# Patient Record
Sex: Female | Born: 1950 | Race: Black or African American | Hispanic: No | Marital: Married | Smoking: Never smoker
Health system: Southern US, Community
[De-identification: ages and names within clinical notes are randomized; demographics above are authoritative.]

## PROBLEM LIST (undated history)

## (undated) DIAGNOSIS — M8580 Other specified disorders of bone density and structure, unspecified site: Secondary | ICD-10-CM

## (undated) DIAGNOSIS — G629 Polyneuropathy, unspecified: Secondary | ICD-10-CM

## (undated) DIAGNOSIS — M109 Gout, unspecified: Secondary | ICD-10-CM

## (undated) DIAGNOSIS — R569 Unspecified convulsions: Secondary | ICD-10-CM

## (undated) DIAGNOSIS — I10 Essential (primary) hypertension: Secondary | ICD-10-CM

## (undated) HISTORY — DX: Other specified disorders of bone density and structure, unspecified site: M85.80

## (undated) HISTORY — DX: Gout, unspecified: M10.9

## (undated) HISTORY — DX: Unspecified convulsions: R56.9

## (undated) HISTORY — PX: MULTIPLE TOOTH EXTRACTIONS: SHX2053

## (undated) HISTORY — DX: Essential (primary) hypertension: I10

## (undated) HISTORY — DX: Polyneuropathy, unspecified: G62.9

---

## 1997-11-23 DIAGNOSIS — R569 Unspecified convulsions: Secondary | ICD-10-CM

## 1997-11-23 HISTORY — DX: Unspecified convulsions: R56.9

## 1997-12-09 ENCOUNTER — Emergency Department (HOSPITAL_COMMUNITY): Admission: EM | Admit: 1997-12-09 | Discharge: 1997-12-09 | Payer: Self-pay | Admitting: Emergency Medicine

## 1998-12-23 ENCOUNTER — Ambulatory Visit (HOSPITAL_COMMUNITY): Admission: RE | Admit: 1998-12-23 | Discharge: 1998-12-23 | Payer: Self-pay | Admitting: Obstetrics and Gynecology

## 1998-12-23 ENCOUNTER — Encounter: Payer: Self-pay | Admitting: Obstetrics and Gynecology

## 1999-08-29 ENCOUNTER — Encounter: Admission: RE | Admit: 1999-08-29 | Discharge: 1999-08-29 | Payer: Self-pay | Admitting: Family Medicine

## 1999-08-29 ENCOUNTER — Encounter: Payer: Self-pay | Admitting: Family Medicine

## 2000-10-15 ENCOUNTER — Encounter: Payer: Self-pay | Admitting: Family Medicine

## 2000-10-15 ENCOUNTER — Encounter: Admission: RE | Admit: 2000-10-15 | Discharge: 2000-10-15 | Payer: Self-pay | Admitting: Family Medicine

## 2002-01-26 ENCOUNTER — Encounter: Admission: RE | Admit: 2002-01-26 | Discharge: 2002-01-26 | Payer: Self-pay | Admitting: Family Medicine

## 2002-01-26 ENCOUNTER — Encounter: Payer: Self-pay | Admitting: Family Medicine

## 2003-03-23 ENCOUNTER — Other Ambulatory Visit: Admission: RE | Admit: 2003-03-23 | Discharge: 2003-03-23 | Payer: Self-pay | Admitting: Obstetrics and Gynecology

## 2006-02-01 ENCOUNTER — Encounter: Admission: RE | Admit: 2006-02-01 | Discharge: 2006-02-01 | Payer: Self-pay | Admitting: Pediatrics

## 2007-12-16 ENCOUNTER — Encounter (INDEPENDENT_AMBULATORY_CARE_PROVIDER_SITE_OTHER): Payer: Self-pay | Admitting: Obstetrics and Gynecology

## 2007-12-16 ENCOUNTER — Ambulatory Visit (HOSPITAL_COMMUNITY): Admission: RE | Admit: 2007-12-16 | Discharge: 2007-12-16 | Payer: Self-pay | Admitting: Obstetrics and Gynecology

## 2010-01-28 ENCOUNTER — Encounter: Admission: RE | Admit: 2010-01-28 | Discharge: 2010-01-28 | Payer: Self-pay | Admitting: Internal Medicine

## 2010-07-08 NOTE — H&P (Signed)
Whitney Hines, Whitney Hines NO.:  [id number]   MEDICAL RECORD NO.:  [id number]          PATIENT TYPE:  AMB   LOCATION:  SDC                           FACILITY:  [facility]   PHYSICIAN:  Juluis Mire, M.D.   DATE OF BIRTH:  1950/10/21   DATE OF ADMISSION:  DATE OF DISCHARGE:                              HISTORY & PHYSICAL   The patient is a 60 year old gravida 2, para 2 female who presents for  LEEP procedure in the cervix along with hysteroscopy and D and C.   In relation to present admission, first of all the patient's last Pap  feel atypical squamous cells of undetermined significance did test  positive for high-risk HPV.  Underwent colposcopy with biopsy with  finding of low-grade dysplasia.  We initially thought about freezing the  cervix in the office, but in view of proceeding with hysteroscopy, we  were going to do LEEP of the cervix at the same time.  Nature of LEEP  has been discussed.   It is also known her Pap smear that she had benign appearing endometrial  cells and blood.  Her previous FSH was consistent with menopause.  We  did a saline infusion ultrasound that revealed intracavitary mass  consistent with polyp.  She now proceeds for hysteroscopic resection.   In terms of allergies, the patient has no known drug allergies.   MEDICATION:  Toprol-XL 25 mg and Ortho-Novum 7/7/7.   PAST MEDICAL HISTORY:  She does have a history of heart irregularity  that is why she is on Toprol followed by cardiologist, otherwise, usual  childhood diseases.  No previous surgical history.   OBSTETRICAL HISTORY:  Two vaginal deliveries.   FAMILY HISTORY:  There is a history of hypertension, diabetes,  osteoporosis as well as heart disease.   SOCIAL HISTORY:  Reveals no tobacco or alcohol use.   REVIEW OF SYSTEMS:  Noncontributory.   PHYSICAL EXAMINATION:  VITAL SIGNS:  The patient is afebrile, stable  vital signs.  HEENT:  The patient is normocephalic.  Pupils equal,  round, and reactive  to light and accommodation.  Extraocular movements were intact.  Sclerae  and conjunctiva are clear.  Oropharynx clear.  NECK:  Without thyromegaly.  BREASTS:  No discrete masses.  LUNGS:  Clear.  CARDIOVASCULAR:  Regular rate. No murmurs or gallop.  ABDOMEN:  Benign.  No mass, organomegaly, or tenderness.  PELVIC:  Normal external genitalia.  Vaginal mucosa is clear.  Cervix  unremarkable.  Uterus normal size, shape, and contour.  Adnexa free of  masses or tenderness.   IMPRESSION:  1. Cervical dysplasia.  2. Endometrial polyp.   PLAN:  The patient will undergo LEEP of the cervix along with  hysteroscopy D and C.  The risks have been discussed including the risk  of infection.  The risk of vascular injury that could lead to  hemorrhage, requiring transfusion with the risk of AIDS or hepatitis.  Excessive bleeding could require hysterectomy.  There is a risk of  perforation lead to injury to adjacent organs such as bowel require  exploratory  surgery.  The risk of deep venous thrombosis and pulmonary  embolus.  The patient appears to understand indications and risks.      Juluis Mire, M.D.  Electronically Signed     JSM/MEDQ  D:  12/16/2007  T:  12/16/2007  Job:  [id number]

## 2010-07-08 NOTE — Op Note (Signed)
Whitney Hines, Whitney Hines                  ACCOUNT NO.:  [id number]   MEDICAL RECORD NO.:  [id number]          PATIENT TYPE:  AMB   LOCATION:  SDC                           [facility]:  [facility]   PHYSICIAN:  Dois Davenport A. Rivard, M.D. DATE OF BIRTH:  November 25, 1950   DATE OF PROCEDURE:  12/16/2007  DATE OF DISCHARGE:                               OPERATIVE REPORT   PREOPERATIVE DIAGNOSIS:  Endometrial polyp.   POSTOPERATIVE DIAGNOSIS:  Endometrial atrophy.   ANESTHESIA:  IV sedation with Dr. Jean Rosenthal.   PROCEDURES:  Hysteroscopy, dilation and curettage.   SURGEON:  Crist Fat. Rivard, MD   ASSISTANT:  None.   ESTIMATED BLOOD LOSS:  Minimal.   PROCEDURE:  After being informed of the planned procedure with possible  complications including bleeding, infection and injury to uterus,  informed consent was obtained.  The patient was taken to OR #7 given IV  sedation with laryngeal mask and placed in the lithotomy position.  She  was prepped and draped in a sterile fashion and her bladder was emptied  with an in-and-out Foley catheter.  Pelvic exam was unremarkable with a  small uterus and 2 normal adnexa.  A weighted speculum was inserted.  Anterior lip of the cervix was grasped with a tenaculum forceps and we  proceeded with a paracervical block using Novocaine 1% 20 mL in the  usual fashion.  Uterus was sounded at 6 cm and the cervix was easily  dilated using Hegar dilator until #33, which allowed easy entry of an  operative hysteroscope with a single loop attached.  With a perfusion of  sorbitol at a maximum pressure of 80 mmHg, we were able to visualize the  entire uterine cavity with both tubal ostia and there was no sign of any  polyp.  On the left side of the uterine wall, there was a slight  muscular ridge, which could have accounted for the polyp identified on  sonohysterogram.  Hysteroscope was removed.  A sharp curette was used to  remove the superficial endometrium for analysis and instruments  were  removed.  A bleeding site on the cervix was controlled with a figure-of-  eight stitch of 2-0 Vicryl.  Instrument and sponge count was complete  x2.  Estimated blood loss was minimal.  Water deficit was 30 mL and the  procedure was very well tolerated by the patient who was taken to  recovery room in a well and stable condition and discharged home today.   SPECIMEN:  Endometrial curettings sent to pathology.      Crist Fat Rivard, M.D.  Electronically Signed    SAR/MEDQ  D:  12/16/2007  T:  12/17/2007  Job:  [id number]

## 2010-11-25 LAB — CBC
Hemoglobin: 13.4
Platelets: 254
RDW: 13.1

## 2010-11-25 LAB — COMPREHENSIVE METABOLIC PANEL
ALT: 24
AST: 31
Albumin: 3.7
Alkaline Phosphatase: 123 — ABNORMAL HIGH
Calcium: 8.9
GFR calc Af Amer: >60
Potassium: 3.6
Sodium: 136
Total Protein: 8.5 — ABNORMAL HIGH

## 2011-04-20 ENCOUNTER — Other Ambulatory Visit: Payer: Self-pay | Admitting: Internal Medicine

## 2011-04-20 DIAGNOSIS — Z1231 Encounter for screening mammogram for malignant neoplasm of breast: Secondary | ICD-10-CM

## 2011-05-04 ENCOUNTER — Ambulatory Visit
Admission: RE | Admit: 2011-05-04 | Discharge: 2011-05-04 | Disposition: A | Discharge status: Home / Self Care | Payer: BC Managed Care – PPO | Source: Ambulatory Visit | Attending: Internal Medicine | Admitting: Internal Medicine

## 2011-05-04 DIAGNOSIS — Z1231 Encounter for screening mammogram for malignant neoplasm of breast: Secondary | ICD-10-CM

## 2012-06-16 ENCOUNTER — Other Ambulatory Visit: Payer: Self-pay | Admitting: Neurology

## 2012-12-17 ENCOUNTER — Other Ambulatory Visit: Payer: Self-pay | Admitting: Neurology

## 2012-12-20 ENCOUNTER — Other Ambulatory Visit: Payer: Self-pay

## 2012-12-20 MED ORDER — PHENOBARBITAL 100 MG PO TABS: 100.0000 mg | ORAL_TABLET | Freq: Every day | ORAL | Status: DC

## 2013-02-09 ENCOUNTER — Ambulatory Visit: Payer: Self-pay | Admitting: Nurse Practitioner

## 2013-05-04 ENCOUNTER — Encounter: Payer: Self-pay | Admitting: Nurse Practitioner

## 2013-05-15 ENCOUNTER — Encounter: Payer: Self-pay | Admitting: Nurse Practitioner

## 2013-05-15 ENCOUNTER — Encounter (INDEPENDENT_AMBULATORY_CARE_PROVIDER_SITE_OTHER): Payer: Self-pay

## 2013-05-15 ENCOUNTER — Ambulatory Visit (INDEPENDENT_AMBULATORY_CARE_PROVIDER_SITE_OTHER): Payer: BC Managed Care – PPO | Admitting: Nurse Practitioner

## 2013-05-15 VITALS — BP 121/73 | HR 88 | Ht 61.5 in | Wt 179.0 lb

## 2013-05-15 DIAGNOSIS — Z5181 Encounter for therapeutic drug level monitoring: Secondary | ICD-10-CM

## 2013-05-15 DIAGNOSIS — G40309 Generalized idiopathic epilepsy and epileptic syndromes, not intractable, without status epilepticus: Secondary | ICD-10-CM | POA: Insufficient documentation

## 2013-05-15 MED ORDER — PHENOBARBITAL 100 MG PO TABS: 100.0000 mg | ORAL_TABLET | Freq: Every day | ORAL | Status: DC

## 2013-05-15 NOTE — Progress Notes (Signed)
I have read the note, and I agree with the clinical assessment and plan.  Mahum Betten KEITH   

## 2013-05-15 NOTE — Progress Notes (Signed)
[facility]  PATIENT: Whitney Hines DOB: May 30, 1950   REASON FOR VISIT: follow up for seizure disorder    HISTORY OF PRESENT ILLNESS:Whitney Hines , 63 year old female returns for followup. She has a history of seizure disorder with last seizure occurring in 1999. She denies any difficulty tolerating her phenobarbital, she has not had any balance issues, no falls no sleep disturbance. She continues to work full time and she is very active. She returns for reevaluation   HISTORY:of seizure disorder and has been well controlled on phenobarbital. Last seizure was in 1999. She denies any difficulty tolerating her medication, no confusion,staring spells, no balance issues, sleep disturbance ,no falls. She continues to work full-time,she is very active and has continued her exercise program.   REVIEW OF SYSTEMS: Full 14 system review of systems performed and notable only for those listed, all others are neg:  Constitutional: N/A  Cardiovascular: N/A  Ear/Nose/Throat: N/A  Skin: N/A  Eyes: N/A  Respiratory: N/A  Gastroitestinal: N/A  Hematology/Lymphatic: N/A  Endocrine: N/A Musculoskeletal:N/A  Allergy/Immunology: N/A  Neurological: N/A Psychiatric: N/A   ALLERGIES: Allergies  Allergen Reactions  . Dilantin [Phenytoin]     HOME MEDICATIONS: Outpatient Prescriptions Prior to Visit  Medication Sig Dispense Refill  . lisinopril-hydrochlorothiazide (PRINZIDE,ZESTORETIC) 20-12.5 MG per tablet Take 1 tablet by mouth daily.      Marland Kitchen PHENObarbital (LUMINAL) 100 MG tablet Take 1 tablet (100 mg total) by mouth at bedtime.  90 tablet  1   No facility-administered medications prior to visit.    PAST MEDICAL HISTORY: Past Medical History  Diagnosis Date  . High blood pressure   . Seizure     PAST SURGICAL HISTORY: Past Surgical History  Procedure Laterality Date  . None      FAMILY HISTORY: History reviewed. No pertinent family history.  SOCIAL  HISTORY: History   Social History  . Marital Status: Married    Spouse Name: N/A    Number of Children: 0  . 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 Smoking status: Never Smoker   . Smokeless tobacco: Never Used  . Alcohol Use: No  . Drug Use: No  . Sexual Activity: Not on file   Other Topics Concern  . 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    Patient has BS degree.    Patient does not have any children.    Patient drinks 8 ounces of caffeine daily.    Patient is left handed.      PHYSICAL EXAM  Filed Vitals:   05/15/13 1512  BP: 121/73  Pulse: 88  Height: 5' 1.5" (1.562 m)  Weight: 179 lb (81.194 kg)   Body mass index is 33.28 kg/(m^2).  Generalized: Well developed, in no acute distress  Neurological examination   Mentation: Alert oriented to time, place, history taking. Follows all commands speech and language fluent  Cranial nerve II-XII: Pupils were equal round reactive to light extraocular movements were full, visual field were full on confrontational test. Facial sensation and strength were normal. hearing was intact to finger rubbing bilaterally. Uvula tongue midline. head turning and shoulder shrug were normal and symmetric.Tongue protrusion into cheek strength was normal. Motor: normal bulk and tone, full strength in the BUE, BLE, fine  finger movements normal, no pronator drift. No focal weakness Coordination: finger-nose-finger, heel-to-shin bilaterally, no dysmetria Reflexes: Brachioradialis 2/2, biceps 2/2, triceps 2/2, patellar 2/2, Achilles 2/2, plantar responses were flexor bilaterally. Gait and Station: Rising up from seated position without assistance, normal stance,  moderate stride, good arm swing, smooth turning, able to perform tiptoe, and heel walking  without difficulty. Tandem gait is steady  DIAGNOSTIC DATA (LABS, IMAGING, TESTING) - ASSESSMENT AND PLAN  63 y.o. year old female  has a past medical history of High blood pressure and Seizure. here to followup for seizure disorder. Last seizure occurred in 1999.  Pt to continue Phenobarb at current dose.  Will refill Will get labs from Dr. Arbutus Ped F/U in 1 year. [name], [facility], [facility], APRN  [facility] [address], [place], [place] [address] [phone]

## 2013-05-15 NOTE — Patient Instructions (Signed)
Pt to continue Phenobarb at current dose.  Will refill Will get labs from Dr. Arbutus Ped F/U in 1 year.

## 2013-06-23 ENCOUNTER — Other Ambulatory Visit: Payer: Self-pay | Admitting: Neurology

## 2013-08-23 ENCOUNTER — Ambulatory Visit (HOSPITAL_BASED_OUTPATIENT_CLINIC_OR_DEPARTMENT_OTHER)
Admission: RE | Admit: 2013-08-23 | Discharge: 2013-08-23 | Disposition: A | Discharge status: Home / Self Care | Payer: Worker's Compensation | Source: Ambulatory Visit | Attending: Nurse Practitioner | Admitting: Nurse Practitioner

## 2013-08-23 ENCOUNTER — Emergency Department (HOSPITAL_BASED_OUTPATIENT_CLINIC_OR_DEPARTMENT_OTHER)
Admission: EM | Admit: 2013-08-23 | Discharge: 2013-08-23 | Disposition: A | Discharge status: Home / Self Care | Payer: BC Managed Care – PPO

## 2013-08-23 DIAGNOSIS — W19XXXA Unspecified fall, initial encounter: Secondary | ICD-10-CM | POA: Insufficient documentation

## 2013-08-23 DIAGNOSIS — S0990XA Unspecified injury of head, initial encounter: Secondary | ICD-10-CM | POA: Diagnosis not present

## 2013-08-31 ENCOUNTER — Telehealth: Payer: Self-pay | Admitting: Nurse Practitioner

## 2013-08-31 NOTE — Telephone Encounter (Signed)
Patient requesting appointment with Hoyle Sauer due to fall at work and hit back of head.  She's had skull xray and Ct scan and would like to see CM, which was encouraged by her employer.  Please call and advise.

## 2013-09-01 NOTE — Telephone Encounter (Signed)
Patient is scheduled for 09-05-13

## 2013-09-01 NOTE — Telephone Encounter (Signed)
Please schedule appt

## 2013-09-05 ENCOUNTER — Encounter: Payer: Self-pay | Admitting: Nurse Practitioner

## 2013-09-05 ENCOUNTER — Ambulatory Visit (INDEPENDENT_AMBULATORY_CARE_PROVIDER_SITE_OTHER): Payer: BC Managed Care – PPO | Admitting: Nurse Practitioner

## 2013-09-05 ENCOUNTER — Encounter (INDEPENDENT_AMBULATORY_CARE_PROVIDER_SITE_OTHER): Payer: Self-pay

## 2013-09-05 VITALS — BP 128/78 | HR 75 | Ht 61.5 in | Wt 179.8 lb

## 2013-09-05 DIAGNOSIS — S060X0A Concussion without loss of consciousness, initial encounter: Secondary | ICD-10-CM

## 2013-09-05 DIAGNOSIS — G40309 Generalized idiopathic epilepsy and epileptic syndromes, not intractable, without status epilepticus: Secondary | ICD-10-CM

## 2013-09-05 NOTE — Patient Instructions (Signed)
Continue phenobarbital at current dose Given information on concussion Continue gabapentin at current dose Followup in 6 months

## 2013-09-05 NOTE — Progress Notes (Signed)
I have read the note, and I agree with the clinical assessment and plan.  Whitney Hines,Whitney Hines   

## 2013-09-05 NOTE — Progress Notes (Signed)
[facility]  PATIENT: Whitney Hines DOB: November 22, 1950   REASON FOR VISIT: Followup for seizure disorder, concussion   HISTORY OF PRESENT ILLNESS:[name] , 63 year old female returns for followup. She has a history of seizure disorder with last seizure occurring in 1999. She denies any difficulty tolerating her phenobarbital. She fell at work and hit the back of her head on 08/23/2013. She works for [employer]. She was taken to urgent care and into the emergency room at that time. CT of the head was negative for anything acute. She was given gabapentin to take. She is having an occasional headache along with ringing in the ears. She continues to work full time but is working a different shift at present . She returns for reevaluation  HISTORY:of seizure disorder and has been well controlled on phenobarbital. Last seizure was in 1999. She denies any difficulty tolerating her medication, no confusion,staring spells, no balance issues, sleep disturbance ,no falls. She continues to work full-time,she is very active and has continued her exercise program.     REVIEW OF SYSTEMS: Full 14 system review of systems performed and notable only for those listed, all others are neg:  Constitutional: N/A  Cardiovascular: N/A  Ear/Nose/Throat: Ringing in the ears Skin: N/A  Eyes: N/A  Respiratory: N/A  Gastroitestinal: N/A  Hematology/Lymphatic: N/A  Endocrine: N/A Musculoskeletal:N/A  Allergy/Immunology: N/A  Neurological: Headache  Psychiatric: N/A Sleep : NA   ALLERGIES: Allergies  Allergen Reactions  . Dilantin [Phenytoin]     HOME MEDICATIONS: Outpatient Prescriptions Prior to Visit  Medication Sig Dispense Refill  . lisinopril-hydrochlorothiazide (PRINZIDE,ZESTORETIC) 20-12.5 MG per tablet Take 1 tablet by mouth daily.      Marland Kitchen PHENObarbital (LUMINAL) 100 MG tablet Take 1 tablet (100 mg total) by mouth at bedtime.  90 tablet  1   No facility-administered  medications prior to visit.    PAST MEDICAL HISTORY: Past Medical History  Diagnosis Date  . High blood pressure   . Seizure     PAST SURGICAL HISTORY: Past Surgical History  Procedure Laterality Date  . None      FAMILY HISTORY: History reviewed. No pertinent family history.  SOCIAL HISTORY: History   Social History  . Marital Status: Married    Spouse Name: N/A    Number of Children: 0  . 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 Smoking status: Never Smoker   . Smokeless tobacco: Never Used  . Alcohol Use: No  . Drug Use: No  . Sexual Activity: Not on file   Other Topics Concern  . 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    Patient has BS degree.    Patient does not have any children.    Patient drinks 8 ounces of caffeine daily.    Patient is left handed.      PHYSICAL EXAM  Filed Vitals:   09/05/13 1514  BP: 128/78  Pulse: 75  Height: 5' 1.5" (1.562 m)  Weight: 179 lb 12.8 oz (81.557 kg)   Body mass index is 33.43 kg/(m^2). Generalized: Well developed, in no acute distress , bruising noted under right eye  Neurological examination  Mentation: Alert oriented to time, place, history taking. Follows all commands speech and language fluent  Cranial  nerve II-XII: Pupils were equal round reactive to light extraocular movements were full, visual field were full on confrontational test. Facial sensation and strength were normal. hearing was intact to finger rubbing bilaterally. Uvula tongue midline. head turning and shoulder shrug were normal and symmetric.Tongue protrusion into cheek strength was normal.  Motor: normal bulk and tone, full strength in the BUE, BLE, fine finger movements normal, no pronator drift. No focal weakness  Coordination: finger-nose-finger,  heel-to-shin bilaterally, no dysmetria  Sensory: Intact to touch, pinprick and vibratory Reflexes: Brachioradialis 2/2, biceps 2/2, triceps 2/2, patellar 2/2, Achilles 2/2, plantar responses were flexor bilaterally.  Gait and Station: Rising up from seated position without assistance, normal stance, moderate stride, good arm swing, smooth turning, able to perform tiptoe, and heel walking without difficulty. Tandem gait is steady  DIAGNOSTIC DATA (LABS, IMAGING, TESTING)  ASSESSMENT AND PLAN  63 y.o. year old female  has a past medical history of High blood pressure and Seizure. and recent fall with concussion.  Continue phenobarbital at current dose Given information on concussion Call for any worsening of headache or seizure activity Continue gabapentin at current dose Followup in 6 months [name], [facility], [facility], [facility] [address] [place], [place] [address] [phone]

## 2013-12-26 ENCOUNTER — Other Ambulatory Visit: Payer: Self-pay

## 2013-12-26 MED ORDER — PHENOBARBITAL 100 MG PO TABS: 100.0000 mg | ORAL_TABLET | Freq: Every day | ORAL | Status: DC

## 2013-12-26 NOTE — Telephone Encounter (Signed)
RX signed and faxed.

## 2014-01-10 ENCOUNTER — Encounter: Payer: Self-pay | Admitting: Neurology

## 2014-01-16 ENCOUNTER — Encounter: Payer: Self-pay | Admitting: Neurology

## 2014-02-07 ENCOUNTER — Telehealth: Payer: Self-pay | Admitting: Nurse Practitioner

## 2014-02-07 NOTE — Telephone Encounter (Signed)
Patient is calling because she needs to discuss her father that coded on 01/30/14. Patient also has questions about FMLA paperwork that she will fax to our office. Please call.

## 2014-02-07 NOTE — Telephone Encounter (Signed)
I called the patient and found out that her Dad has been in the hospital and that the patient wanted to get FMLA paperwork completed because of her being out of work to spend time caring for her Dad.  I explained to her that she would need to go through her PCP or sometimes the physician of the patient will complete FMLA paperwork for the family members who are caring for their patient.  Pt voiced understanding.

## 2014-02-23 HISTORY — PX: COLONOSCOPY: SHX174

## 2014-03-08 ENCOUNTER — Encounter: Payer: Self-pay | Admitting: Nurse Practitioner

## 2014-03-08 ENCOUNTER — Ambulatory Visit (INDEPENDENT_AMBULATORY_CARE_PROVIDER_SITE_OTHER): Payer: BLUE CROSS/BLUE SHIELD | Admitting: Nurse Practitioner

## 2014-03-08 VITALS — BP 129/80 | HR 99 | Ht 62.0 in | Wt 174.0 lb

## 2014-03-08 DIAGNOSIS — S060X0D Concussion without loss of consciousness, subsequent encounter: Secondary | ICD-10-CM

## 2014-03-08 DIAGNOSIS — G40309 Generalized idiopathic epilepsy and epileptic syndromes, not intractable, without status epilepticus: Secondary | ICD-10-CM

## 2014-03-08 DIAGNOSIS — Z5181 Encounter for therapeutic drug level monitoring: Secondary | ICD-10-CM

## 2014-03-08 MED ORDER — PHENOBARBITAL 100 MG PO TABS: 100.0000 mg | ORAL_TABLET | Freq: Every day | ORAL | Status: DC

## 2014-03-08 NOTE — Patient Instructions (Signed)
Continue Phenobarb at current dose Call for seizure disorder Will get labs today F/U 6 months

## 2014-03-08 NOTE — Progress Notes (Signed)
I have read the note, and I agree with the clinical assessment and plan.  WILLIS,CHARLES KEITH   

## 2014-03-08 NOTE — Progress Notes (Signed)
[facility]  PATIENT: Whitney Hines DOB: 07-Nov-1950   REASON FOR VISIT: Follow-up for seizure disorder  HISTORY FROM: Patient    HISTORY OF PRESENT ILLNESS:Whitney Hines , 64 year old female returns for followup. She has a history of seizure disorder with last seizure occurring in 1999. She denies any difficulty tolerating her phenobarbital. She fell at work and hit the back of her head on 08/23/2013. She works for [employer]. She was taken to urgent care and into the emergency room at that time. CT of the head was negative for anything acute. She was given gabapentin to take. She is having an occasional headache along with ringing in the ears. She continues to work full time but is working a different shift at present . She is under a lot of stress with her father being hospitalized in [place] and  her husband currently sick here.  She returns for reevaluation. She needs lab work and refills  HISTORY:of seizure disorder and has been well controlled on phenobarbital. Last seizure was in 1999. She denies any difficulty tolerating her medication, no confusion,staring spells, no balance issues, sleep disturbance ,no falls. She continues to work full-time,she is very active and has continued her exercise program.     REVIEW OF SYSTEMS: Full 14 system review of systems performed and notable only for those listed, all others are neg:  Constitutional: Fatigue Cardiovascular: neg Ear/Nose/Throat: neg  Skin: neg Eyes: neg Respiratory: neg Gastroitestinal: neg  Hematology/Lymphatic: neg  Endocrine: neg Musculoskeletal:neg Allergy/Immunology: neg Neurological: Occasional headache, history of seizure disorder Psychiatric: neg Sleep : neg   ALLERGIES: Allergies  Allergen Reactions  . Dilantin [Phenytoin]     HOME MEDICATIONS: Outpatient Prescriptions Prior to Visit  Medication Sig Dispense Refill  . gabapentin (NEURONTIN) 100 MG capsule Take 100 mg by mouth  daily.     Marland Kitchen lisinopril-hydrochlorothiazide (PRINZIDE,ZESTORETIC) 20-12.5 MG per tablet Take 1 tablet by mouth daily.    Marland Kitchen PHENObarbital (LUMINAL) 100 MG tablet Take 1 tablet (100 mg total) by mouth at bedtime. 90 tablet 1   No facility-administered medications prior to visit.    PAST MEDICAL HISTORY: Past Medical History  Diagnosis Date  . High blood pressure   . Seizure     PAST SURGICAL HISTORY: Past Surgical History  Procedure Laterality Date  . None      FAMILY HISTORY: History reviewed. No pertinent family history.  SOCIAL HISTORY: History   Social History  . Marital Status: Married    Spouse Name: N/A    Number of Children: 0  . 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 Smoking status: Never Smoker   . Smokeless tobacco: Never Used  . Alcohol Use: No  . Drug Use: No  . Sexual Activity: Not on file   Other Topics Concern  . 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    Patient has BS degree.    Patient does not have any children.    Patient drinks 8 ounces of caffeine daily.    Patient is left handed.      PHYSICAL EXAM  Filed Vitals:   03/08/14 1515  BP: 129/80  Pulse: 99  Height: 5\' 2"  (1.575 m)  Weight: 174 lb (78.926 kg)   Body mass index  is 31.82 kg/(m^2). Generalized: Well developed, in no acute distress   Neurological examination  Mentation: Alert oriented to time, place, history taking. Follows all commands speech and language fluent  Cranial nerve II-XII: Pupils were equal round reactive to light extraocular movements were full, visual field were full on confrontational test. Facial sensation and strength were normal. hearing was intact to finger rubbing bilaterally. Uvula tongue midline. head turning and shoulder shrug were normal and  symmetric.Tongue protrusion into cheek strength was normal.  Motor: normal bulk and tone, full strength in the BUE, BLE, fine finger movements normal, no pronator drift. No focal weakness  Coordination: finger-nose-finger, heel-to-shin bilaterally, no dysmetria  Sensory: Intact to touch, pinprick and vibratory Reflexes: Brachioradialis 2/2, biceps 2/2, triceps 2/2, patellar 2/2, Achilles 2/2, plantar responses were flexor bilaterally.  Gait and Station: Rising up from seated position without assistance, normal stance, moderate stride, good arm swing, smooth turning, able to perform tiptoe, and heel walking without difficulty. Tandem gait is steady  DIAGNOSTIC DATA (LABS, IMAGING, TESTING) - ASSESSMENT AND PLAN  64 y.o. year old female  has a past medical history of High blood pressure and Seizure. here to follow-up. Seizure disorder stable  Continue Phenobarb at current dose, RX to patient Call for seizure activity Will get labs today, CBC, CMP and PB level F/U 6 months Whitney Bible, Whitney Hines, Whitney Hines, Whitney Hines [address], [place], [place] [address] [phone]

## 2014-03-09 LAB — CBC WITH DIFFERENTIAL/PLATELET
Basophils Absolute: 0 10*3/uL (ref 0.0–0.2)
Basos: 0 %
Eos: 0 %
Eosinophils Absolute: 0 10*3/uL (ref 0.0–0.4)
HCT: 35.8 % (ref 34.0–46.6)
HEMOGLOBIN: 12 g/dL (ref 11.1–15.9)
Immature Grans (Abs): 0 10*3/uL (ref 0.0–0.1)
Immature Granulocytes: 0 %
Lymphocytes Absolute: 2.8 10*3/uL (ref 0.7–3.1)
Lymphs: 38 %
MCH: 30.1 pg (ref 26.6–33.0)
MCHC: 33.5 g/dL (ref 31.5–35.7)
MCV: 90 fL (ref 79–97)
MONOS ABS: 0.3 10*3/uL (ref 0.1–0.9)
Monocytes: 4 %
NEUTROS PCT: 58 %
Neutrophils Absolute: 4.2 10*3/uL (ref 1.4–7.0)
RBC: 3.99 x10E6/uL (ref 3.77–5.28)
RDW: 13.6 % (ref 12.3–15.4)
WBC: 7.4 10*3/uL (ref 3.4–10.8)

## 2014-03-09 LAB — COMPREHENSIVE METABOLIC PANEL
ALBUMIN: 4.2 g/dL (ref 3.6–4.8)
ALK PHOS: 161 IU/L — AB (ref 39–117)
ALT: 15 IU/L (ref 0–32)
AST: 19 IU/L (ref 0–40)
Albumin/Globulin Ratio: 1.1 (ref 1.1–2.5)
BUN/Creatinine Ratio: 12 (ref 11–26)
BUN: 13 mg/dL (ref 8–27)
CALCIUM: 9 mg/dL (ref 8.7–10.3)
CO2: 23 mmol/L (ref 18–29)
Chloride: 101 mmol/L (ref 97–108)
Creatinine, Ser: 1.09 mg/dL — ABNORMAL HIGH (ref 0.57–1.00)
GFR calc non Af Amer: 55 mL/min/{1.73_m2} — ABNORMAL LOW (ref >59)
GFR, EST AFRICAN AMERICAN: 63 mL/min/{1.73_m2} (ref >59)
GLOBULIN, TOTAL: 3.9 g/dL (ref 1.5–4.5)
Glucose: 78 mg/dL (ref 65–99)
Potassium: 4.2 mmol/L (ref 3.5–5.2)
SODIUM: 140 mmol/L (ref 134–144)
Total Bilirubin: <0.2 mg/dL (ref 0.0–1.2)
Total Protein: 8.1 g/dL (ref 6.0–8.5)

## 2014-03-09 LAB — PHENOBARBITAL LEVEL: PHENOBARBITAL LVL: 34 ug/mL (ref 15–40)

## 2014-05-16 ENCOUNTER — Ambulatory Visit: Payer: BC Managed Care – PPO | Admitting: Nurse Practitioner

## 2014-06-07 ENCOUNTER — Other Ambulatory Visit: Payer: Self-pay | Admitting: Gastroenterology

## 2014-07-02 ENCOUNTER — Other Ambulatory Visit: Payer: Self-pay | Admitting: Neurology

## 2014-07-02 MED ORDER — PHENOBARBITAL 100 MG PO TABS: 100.0000 mg | ORAL_TABLET | Freq: Every day | ORAL | Status: DC

## 2014-07-02 NOTE — Telephone Encounter (Signed)
Patient called and stated that she needed a refill on her Rx. PHENObarbital (LUMINAL) 100 MG tablet.She said that her pharmacy told her they were waiting on [place] in order to fill it. Please call and advise.

## 2014-07-02 NOTE — Telephone Encounter (Signed)
Request entered, forwarded to provider for approval.  

## 2014-07-03 NOTE — Telephone Encounter (Signed)
Rx has been faxed.

## 2014-09-11 ENCOUNTER — Encounter (HOSPITAL_COMMUNITY): Payer: Self-pay | Admitting: *Deleted

## 2014-09-13 ENCOUNTER — Ambulatory Visit (INDEPENDENT_AMBULATORY_CARE_PROVIDER_SITE_OTHER): Payer: BLUE CROSS/BLUE SHIELD | Admitting: Nurse Practitioner

## 2014-09-13 ENCOUNTER — Encounter: Payer: Self-pay | Admitting: Nurse Practitioner

## 2014-09-13 VITALS — BP 124/74 | HR 90 | Ht 62.0 in | Wt 184.0 lb

## 2014-09-13 DIAGNOSIS — Z5181 Encounter for therapeutic drug level monitoring: Secondary | ICD-10-CM

## 2014-09-13 DIAGNOSIS — G40309 Generalized idiopathic epilepsy and epileptic syndromes, not intractable, without status epilepticus: Secondary | ICD-10-CM

## 2014-09-13 NOTE — Patient Instructions (Signed)
Continue phenobarbital at current dose Labs reviewed from 6 months ago Follow-up in 6 months Call for any seizure activity

## 2014-09-13 NOTE — Progress Notes (Signed)
[facility]  PATIENT: Whitney Hines DOB: 11/09/1950   REASON FOR VISIT: Follow-up for seizure disorder, history of concussion HISTORY FROM: Patient    HISTORY OF PRESENT ILLNESS:Whitney Hines , 64 year old female returns for followup. She has a history of seizure disorder with last seizure occurring in 1999. She denies any difficulty tolerating her phenobarbital. She fell at work and hit the back of her head on 08/23/2013. She works for [employer]. She was taken to urgent care and into the emergency room at that time. CT of the head was negative for anything acute. She was given gabapentin to take. She is having an occasional headache along with ringing in the ears. She denies any problems with her memory She continues to work full time but is working a different shift at present . She is under a lot of stress father recently died  and her husband currently sick here. She returns for reevaluation. Reviewed labs from 6 months ago  HISTORY:of seizure disorder and has been well controlled on phenobarbital. Last seizure was in 1999. She denies any difficulty tolerating her medication, no confusion,staring spells, no balance issues, sleep disturbance ,no falls. She continues to work full-time,she is very active and has continued her exercise program.     REVIEW OF SYSTEMS: Full 14 system review of systems performed and notable only for those listed, all others are neg:  Constitutional: neg  Cardiovascular: neg Ear/Nose/Throat: neg  Skin: neg Eyes: neg Respiratory: neg Gastroitestinal: neg  Hematology/Lymphatic: neg  Endocrine: neg Musculoskeletal:neg Allergy/Immunology: neg Neurological: neg Psychiatric: neg Sleep : neg   ALLERGIES: Allergies  Allergen Reactions  . Dilantin [Phenytoin]     HOME MEDICATIONS: Outpatient Prescriptions Prior to Visit  Medication Sig Dispense Refill  . lisinopril-hydrochlorothiazide (PRINZIDE,ZESTORETIC) 20-12.5 MG per tablet  Take 1 tablet by mouth daily.    Marland Kitchen PHENObarbital (LUMINAL) 100 MG tablet Take 1 tablet (100 mg total) by mouth at bedtime. 90 tablet 1  . gabapentin (NEURONTIN) 100 MG capsule Take 100 mg by mouth daily.      No facility-administered medications prior to visit.    PAST MEDICAL HISTORY: Past Medical History  Diagnosis Date  . High blood pressure   . Seizure oct 1999    PAST SURGICAL HISTORY: Past Surgical History  Procedure Laterality Date  . Multiple tooth extractions    . Colonscopy  age 34    FAMILY HISTORY: History reviewed. No pertinent family history.  SOCIAL HISTORY: History   Social History  . Marital Status: Married    Spouse Name: N/A  . Number of Children: 0  . Years of Education: BS   Occupational History  . Supervisor     Social History Main Topics  . Smoking status: Never Smoker   . Smokeless tobacco: Never Used  . Alcohol Use: No  . Drug Use: No  . Sexual Activity: Not on file   Other Topics Concern  . 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    Patient has BS degree.    Patient does not have any children.    Patient drinks 8 ounces of caffeine daily.    Patient is left handed.      PHYSICAL EXAM  Filed Vitals:   09/13/14 1510  BP: 124/74  Pulse: 90  Height: 5\' 2"  (1.575 m)  Weight: 184 lb (83.462 kg)  Body mass index is 33.65 kg/(m^2).  Generalized: Well developed, in no acute distress  Head: normocephalic and atraumatic,. Oropharynx benign  Neck: Supple, no carotid bruits  Musculoskeletal: No deformity   Neurological examination   Mentation: Alert oriented to time, place, history taking. Attention span and concentration appropriate. Recent and remote memory intact.  Follows all commands speech and language fluent.   Cranial nerve II-XII: Fundoscopic exam reveals sharp disc margins.Pupils were equal round reactive to  light extraocular movements were full, visual field were full on confrontational test. Facial sensation and strength were normal. hearing was intact to finger rubbing bilaterally. Uvula tongue midline. head turning and shoulder shrug were normal and symmetric.Tongue protrusion into cheek strength was normal. Motor: normal bulk and tone, full strength in the BUE, BLE, fine finger movements normal, no pronator drift. No focal weakness Sensory: normal and symmetric to light touch, pinprick, and  Vibration, proprioception  Coordination: finger-nose-finger, heel-to-shin bilaterally, no dysmetria Reflexes: Brachioradialis 2/2, biceps 2/2, triceps 2/2, patellar 2/2, Achilles 2/2, plantar responses were flexor bilaterally. Gait and Station: Rising up from seated position without assistance, normal stance,  moderate stride, good arm swing, smooth turning, able to perform tiptoe, and heel walking without difficulty. Tandem gait is steady  DIAGNOSTIC DATA (LABS, IMAGING, TESTING) - I reviewed patient records, labs, notes, testing and imaging myself where available.  Lab Results  Component Value Date   WBC 7.4 03/08/2014   HGB 12.0 03/08/2014   HCT 35.8 03/08/2014   MCV 90 03/08/2014   PLT 254 12/15/2007      Component Value Date/Time   NA 140 03/08/2014 1544   NA 136 12/15/2007 1413   K 4.2 03/08/2014 1544   CL 101 03/08/2014 1544   CO2 23 03/08/2014 1544   GLUCOSE 78 03/08/2014 1544   GLUCOSE 77 12/15/2007 1413   BUN 13 03/08/2014 1544   BUN 11 12/15/2007 1413   CREATININE 1.09* 03/08/2014 1544   CALCIUM 9.0 03/08/2014 1544   PROT 8.1 03/08/2014 1544   PROT 8.5* 12/15/2007 1413   ALBUMIN 3.7 12/15/2007 1413   AST 19 03/08/2014 1544   ALT 15 03/08/2014 1544   ALKPHOS 161* 03/08/2014 1544   BILITOT <0.2 03/08/2014 1544   GFRNONAA 55* 03/08/2014 1544   GFRAA 63 03/08/2014 1544    ASSESSMENT AND PLAN  64 y.o. year old female  has a past medical history of High blood pressure and Seizure  (oct 1999). here to follow-up. Seizure disorder is stable. Concussion at work last year. No memory loss associated with this.The patient is a current patient of Dr. Jannifer Franklin  who is out of the office today . This note is sent to the work in doctor.     Continue phenobarbital at current dose Labs reviewed from 6 months ago Follow-up in 6 months Call for any seizure activity [name], [facility], [facility], APRN  [facility] [address] [place], [place] [address] [phone]

## 2014-09-14 ENCOUNTER — Other Ambulatory Visit: Payer: Self-pay | Admitting: Gastroenterology

## 2014-09-17 ENCOUNTER — Encounter (HOSPITAL_COMMUNITY): Payer: Self-pay | Admitting: Certified Registered Nurse Anesthetist

## 2014-09-17 ENCOUNTER — Encounter (HOSPITAL_COMMUNITY)
Admission: RE | Disposition: A | Discharge status: Home / Self Care | Payer: Self-pay | Source: Ambulatory Visit | Attending: Gastroenterology

## 2014-09-17 ENCOUNTER — Ambulatory Visit (HOSPITAL_COMMUNITY)
Admission: RE | Admit: 2014-09-17 | Discharge: 2014-09-17 | Disposition: A | Discharge status: Home / Self Care | Payer: BLUE CROSS/BLUE SHIELD | Source: Ambulatory Visit | Attending: Gastroenterology | Admitting: Gastroenterology

## 2014-09-17 SURGERY — COLONOSCOPY WITH PROPOFOL
Anesthesia: Monitor Anesthesia Care

## 2014-09-17 MED ORDER — PROPOFOL 10 MG/ML IV BOLUS
INTRAVENOUS | Status: AC
  Filled 2014-09-17: qty 20

## 2014-09-17 NOTE — OR Nursing (Signed)
Patient did not do prep as instructed.  To be rescheduled by Dr. Wynetta Emery.   Laverta Baltimore, RN

## 2014-09-17 NOTE — Progress Notes (Signed)
I agree with the assessment and plan as directed by NP .The patient is known to me .   Charlestine Rookstool, MD  

## 2014-12-28 ENCOUNTER — Other Ambulatory Visit: Payer: Self-pay | Admitting: Gastroenterology

## 2014-12-31 DIAGNOSIS — Z6830 Body mass index (BMI) 30.0-30.9, adult: Secondary | ICD-10-CM | POA: Insufficient documentation

## 2014-12-31 DIAGNOSIS — I10 Essential (primary) hypertension: Secondary | ICD-10-CM | POA: Insufficient documentation

## 2015-01-01 ENCOUNTER — Encounter (HOSPITAL_COMMUNITY): Payer: Self-pay | Admitting: *Deleted

## 2015-01-07 ENCOUNTER — Encounter (HOSPITAL_COMMUNITY)
Admission: RE | Disposition: A | Discharge status: Home / Self Care | Payer: Self-pay | Source: Ambulatory Visit | Attending: Gastroenterology

## 2015-01-07 ENCOUNTER — Ambulatory Visit (HOSPITAL_COMMUNITY)
Admission: RE | Admit: 2015-01-07 | Discharge: 2015-01-07 | Disposition: A | Discharge status: Home / Self Care | Payer: BLUE CROSS/BLUE SHIELD | Source: Ambulatory Visit | Attending: Gastroenterology | Admitting: Gastroenterology

## 2015-01-07 ENCOUNTER — Encounter (HOSPITAL_COMMUNITY): Payer: Self-pay | Admitting: *Deleted

## 2015-01-07 ENCOUNTER — Ambulatory Visit (HOSPITAL_COMMUNITY): Payer: BLUE CROSS/BLUE SHIELD | Admitting: Certified Registered Nurse Anesthetist

## 2015-01-07 DIAGNOSIS — G40909 Epilepsy, unspecified, not intractable, without status epilepticus: Secondary | ICD-10-CM | POA: Diagnosis not present

## 2015-01-07 DIAGNOSIS — E063 Autoimmune thyroiditis: Secondary | ICD-10-CM | POA: Diagnosis not present

## 2015-01-07 DIAGNOSIS — K635 Polyp of colon: Secondary | ICD-10-CM | POA: Insufficient documentation

## 2015-01-07 DIAGNOSIS — Z1211 Encounter for screening for malignant neoplasm of colon: Secondary | ICD-10-CM | POA: Diagnosis present

## 2015-01-07 DIAGNOSIS — D122 Benign neoplasm of ascending colon: Secondary | ICD-10-CM | POA: Diagnosis not present

## 2015-01-07 DIAGNOSIS — I10 Essential (primary) hypertension: Secondary | ICD-10-CM | POA: Insufficient documentation

## 2015-01-07 HISTORY — PX: COLONOSCOPY WITH PROPOFOL: SHX5780

## 2015-01-07 SURGERY — COLONOSCOPY WITH PROPOFOL
Anesthesia: Monitor Anesthesia Care

## 2015-01-07 MED ORDER — LACTATED RINGERS IV SOLN
INTRAVENOUS | Status: DC
  Administered 2015-01-07: 1000 mL via INTRAVENOUS

## 2015-01-07 MED ORDER — LIDOCAINE HCL (CARDIAC) 20 MG/ML IV SOLN
INTRAVENOUS | Status: AC
  Filled 2015-01-07: qty 5

## 2015-01-07 MED ORDER — SODIUM CHLORIDE 0.9 % IV SOLN: INTRAVENOUS | Status: DC

## 2015-01-07 MED ORDER — PROPOFOL 10 MG/ML IV BOLUS
INTRAVENOUS | Status: AC
  Filled 2015-01-07: qty 20

## 2015-01-07 MED ORDER — ONDANSETRON HCL 4 MG/2ML IJ SOLN: 4.0000 mg | Freq: Four times a day (QID) | INTRAMUSCULAR | Status: DC | PRN

## 2015-01-07 MED ORDER — PROPOFOL 10 MG/ML IV BOLUS
INTRAVENOUS | Status: DC | PRN
  Administered 2015-01-07: 20 mg via INTRAVENOUS
  Administered 2015-01-07: 10 mg via INTRAVENOUS
  Administered 2015-01-07: 20 mg via INTRAVENOUS
  Administered 2015-01-07: 30 mg via INTRAVENOUS
  Administered 2015-01-07: 10 mg via INTRAVENOUS
  Administered 2015-01-07: 50 mg via INTRAVENOUS
  Administered 2015-01-07: 30 mg via INTRAVENOUS
  Administered 2015-01-07: 20 mg via INTRAVENOUS
  Administered 2015-01-07: 30 mg via INTRAVENOUS
  Administered 2015-01-07 (×3): 20 mg via INTRAVENOUS
  Administered 2015-01-07: 40 mg via INTRAVENOUS
  Administered 2015-01-07: 20 mg via INTRAVENOUS

## 2015-01-07 MED ORDER — OXYCODONE HCL 5 MG PO TABS: 5.0000 mg | ORAL_TABLET | Freq: Once | ORAL | Status: DC | PRN

## 2015-01-07 MED ORDER — LIDOCAINE HCL (CARDIAC) 20 MG/ML IV SOLN
INTRAVENOUS | Status: DC | PRN
  Administered 2015-01-07: 50 mg via INTRAVENOUS

## 2015-01-07 MED ORDER — OXYCODONE HCL 5 MG/5ML PO SOLN: 5.0000 mg | Freq: Once | ORAL | Status: DC | PRN

## 2015-01-07 MED ORDER — FENTANYL CITRATE (PF) 100 MCG/2ML IJ SOLN: 25.0000 ug | INTRAMUSCULAR | Status: DC | PRN

## 2015-01-07 SURGICAL SUPPLY — 22 items

## 2015-01-07 NOTE — H&P (Signed)
  Procedure: Screening colonoscopy. Normal screening colonoscopy performed on 08/22/2004  History: The patient is a 64 year old female born [birth year]. She is scheduled to undergo a repeat screening colonoscopy today.  Medication allergies: Dilantin caused hives  Past medical history: Pelvic laparoscopy performed in 2009. Hashimoto's thyroiditis. Seizure disorder. Hypertension.  Exam: The patient is alert and lying comfortably on the endoscopy stretcher. Abdomen is soft and nontender to palpation. Lungs are clear to auscultation. Cardiac exam reveals a regular rhythm.  Plan: Proceed with screening colonoscopy

## 2015-01-07 NOTE — Discharge Instructions (Signed)
Colon Polyps °Polyps are lumps of extra tissue growing inside the body. Polyps can grow in the large intestine (colon). Most colon polyps are noncancerous (benign). However, some colon polyps can become cancerous over time. Polyps that are larger than a pea may be harmful. To be safe, caregivers remove and test all polyps. °CAUSES  °Polyps form when mutations in the genes cause your cells to grow and divide even though no more tissue is needed. °RISK FACTORS °There are a number of risk factors that can increase your chances of getting colon polyps. They include: °· Being older than 50 years. °· Family history of colon polyps or colon cancer. °· Long-term colon diseases, such as colitis or Crohn disease. °· Being overweight. °· Smoking. °· Being inactive. °· Drinking too much alcohol. °SYMPTOMS  °Most small polyps do not cause symptoms. If symptoms are present, they may include: °· Blood in the stool. The stool may look dark red or black. °· Constipation or diarrhea that lasts longer than 1 week. °DIAGNOSIS °People often do not know they have polyps until their caregiver finds them during a regular checkup. Your caregiver can use 4 tests to check for polyps: °· Digital rectal exam. The caregiver wears gloves and feels inside the rectum. This test would find polyps only in the rectum. °· Barium enema. The caregiver puts a liquid called barium into your rectum before taking X-rays of your colon. Barium makes your colon look white. Polyps are dark, so they are easy to see in the X-ray pictures. °· Sigmoidoscopy. A thin, flexible tube (sigmoidoscope) is placed into your rectum. The sigmoidoscope has a light and tiny camera in it. The caregiver uses the sigmoidoscope to look at the last third of your colon. °· Colonoscopy. This test is like sigmoidoscopy, but the caregiver looks at the entire colon. This is the most common method for finding and removing polyps. °TREATMENT  °Any polyps will be removed during a  sigmoidoscopy or colonoscopy. The polyps are then tested for cancer. °PREVENTION  °To help lower your risk of getting more colon polyps: °· Eat plenty of fruits and vegetables. Avoid eating fatty foods. °· Do not smoke. °· Avoid drinking alcohol. °· Exercise every day. °· Lose weight if recommended by your caregiver. °· Eat plenty of calcium and folate. Foods that are rich in calcium include milk, cheese, and broccoli. Foods that are rich in folate include chickpeas, kidney beans, and spinach. °HOME CARE INSTRUCTIONS °Keep all follow-up appointments as directed by your caregiver. You may need periodic exams to check for polyps. °SEEK MEDICAL CARE IF: °You notice bleeding during a bowel movement. °  °This information is not intended to replace advice given to you by your health care provider. Make sure you discuss any questions you have with your health care provider. °  °Document Released: 11/06/2003 Document Revised: 03/02/2014 Document Reviewed: 04/21/2011 °Elsevier Interactive Patient Education ©2016 Elsevier Inc. ° ° °Colonoscopy, Care After °These instructions give you information on caring for yourself after your procedure. Your doctor may also give you more specific instructions. Call your doctor if you have any problems or questions after your procedure. °HOME CARE °· Do not drive for 24 hours. °· Do not sign important papers or use machinery for 24 hours. °· You may shower. °· You may go back to your usual activities, but go slower for the first 24 hours. °· Take rest breaks often during the first 24 hours. °· Walk around or use warm packs on your belly (abdomen)   if you have belly cramping or gas. °· Drink enough fluids to keep your pee (urine) clear or pale yellow. °· Resume your normal diet. Avoid heavy or fried foods. °· Avoid drinking alcohol for 24 hours or as told by your doctor. °· Only take medicines as told by your doctor. °If a tissue sample (biopsy) was taken during the procedure:  °· Do not take  aspirin or blood thinners for 7 days, or as told by your doctor. °· Do not drink alcohol for 7 days, or as told by your doctor. °· Eat soft foods for the first 24 hours. °GET HELP IF: °You still have a small amount of blood in your poop (stool) 2-3 days after the procedure. °GET HELP RIGHT AWAY IF: °· You have more than a small amount of blood in your poop. °· You see clumps of tissue (blood clots) in your poop. °· Your belly is puffy (swollen). °· You feel sick to your stomach (nauseous) or throw up (vomit). °· You have a fever. °· You have belly pain that gets worse and medicine does not help. °MAKE SURE YOU: °· Understand these instructions. °· Will watch your condition. °· Will get help right away if you are not doing well or get worse. °  °This information is not intended to replace advice given to you by your health care provider. Make sure you discuss any questions you have with your health care provider. °  °Document Released: 03/14/2010 Document Revised: 02/14/2013 Document Reviewed: 10/17/2012 °Elsevier Interactive Patient Education ©2016 Elsevier Inc. ° °

## 2015-01-07 NOTE — Op Note (Signed)
Procedure: Screening colonoscopy. Normal screening colonoscopy performed 08/22/2004  Endoscopist: Earle Gell  Premedication: Propofol administered by anesthesia  Procedure: Patient was placed in the left lateral decubitus position. Anal inspection and digital rectal exam were normal. The Pentax pediatric colonoscope was introduced into the rectum and advanced to the cecum. A normal-appearing ileocecal valve and appendiceal orifice were identified. Colonic preparation for the exam today was good. Withdrawal time was 15 minutes  Rectum. Normal. Retroflexed view of the distal rectum was normal  Sigmoid colon and descending colon. Normal  Splenic flexure. Normal  Transverse colon. From the proximal transverse colon, a 5 mm sessile polyp was removed with the cold snare  Hepatic flexure. Normal.  Ascending colon. From the proximal ascending colon, a 7 mm sessile polyp was removed with the cold snare  Cecum and ileocecal valve. Normal.  Assessment: A small polyp was removed from the proximal transverse colon and a small polyp was removed from the proximal ascending colon. Otherwise normal colonoscopy  Recommendation: I will review the colon polyp pathology to determine when the patient should undergo a repeat  colonoscopy

## 2015-01-07 NOTE — Transfer of Care (Signed)
Immediate Anesthesia Transfer of Care Note  Patient: Whitney Hines  Procedure(s) Performed: Procedure(s): COLONOSCOPY WITH PROPOFOL (N/A)  Patient Location: PACU, endo recovery  Anesthesia Type:MAC  Level of Consciousness: Patient easily awoken, sedated, comfortable, cooperative, following commands, responds to stimulation.   Airway & Oxygen Therapy: Patient spontaneously breathing, ventilating well, oxygen via simple oxygen mask.  Post-op Assessment: Report given to PACU RN, vital signs reviewed and stable, moving all extremities.   Post vital signs: Reviewed and stable.  Complications: No apparent anesthesia complications

## 2015-01-07 NOTE — Anesthesia Preprocedure Evaluation (Signed)
Anesthesia Evaluation  Patient identified by MRN, date of birth, ID band Patient awake    Reviewed: Allergy & Precautions, NPO status , Patient's Chart, lab work & pertinent test results  Airway Mallampati: II   Neck ROM: full    Dental   Pulmonary    breath sounds clear to auscultation       Cardiovascular hypertension,  Rhythm:regular Rate:Normal     Neuro/Psych Seizures -,     GI/Hepatic   Endo/Other    Renal/GU      Musculoskeletal   Abdominal   Peds  Hematology   Anesthesia Other Findings   Reproductive/Obstetrics                             Anesthesia Physical Anesthesia Plan  ASA: II  Anesthesia Plan: MAC   Post-op Pain Management:    Induction: Intravenous  Airway Management Planned: Simple Face Mask  Additional Equipment:   Intra-op Plan:   Post-operative Plan:   Informed Consent: I have reviewed the patients History and Physical, chart, labs and discussed the procedure including the risks, benefits and alternatives for the proposed anesthesia with the patient or authorized representative who has indicated his/her understanding and acceptance.     Plan Discussed with: CRNA, Anesthesiologist and Surgeon  Anesthesia Plan Comments:         Anesthesia Quick Evaluation

## 2015-01-07 NOTE — Anesthesia Postprocedure Evaluation (Signed)
Anesthesia Post Note  Patient: Whitney Hines  Procedure(s) Performed: Procedure(s) (LRB): COLONOSCOPY WITH PROPOFOL (N/A)  Anesthesia type: MAC  Patient location: PACU  Post pain: Pain level controlled and Adequate analgesia  Post assessment: Post-op Vital signs reviewed, Patient's Cardiovascular Status Stable and Respiratory Function Stable  Last Vitals:  Filed Vitals:   01/07/15 1150  BP: 135/76  Pulse: 75  Temp:   Resp: 23    Post vital signs: Reviewed and stable  Level of consciousness: awake, alert  and oriented  Complications: No apparent anesthesia complications

## 2015-01-08 ENCOUNTER — Other Ambulatory Visit: Payer: Self-pay

## 2015-01-08 ENCOUNTER — Encounter (HOSPITAL_COMMUNITY): Payer: Self-pay | Admitting: Gastroenterology

## 2015-01-08 ENCOUNTER — Telehealth: Payer: Self-pay | Admitting: Nurse Practitioner

## 2015-01-08 MED ORDER — PHENOBARBITAL 100 MG PO TABS: 100.0000 mg | ORAL_TABLET | Freq: Every day | ORAL | Status: DC

## 2015-01-08 NOTE — Telephone Encounter (Signed)
Pt called and needs refill PHENObarbital (LUMINAL) 100 MG tablet. Pt out of medication . Thank you

## 2015-01-08 NOTE — Telephone Encounter (Signed)
Rx has been faxed, receipt confirmed by pharmacy.

## 2015-01-10 DIAGNOSIS — D126 Benign neoplasm of colon, unspecified: Secondary | ICD-10-CM | POA: Insufficient documentation

## 2015-03-19 ENCOUNTER — Ambulatory Visit: Payer: BLUE CROSS/BLUE SHIELD | Admitting: Nurse Practitioner

## 2015-03-20 ENCOUNTER — Encounter: Payer: Self-pay | Admitting: Nurse Practitioner

## 2015-05-21 LAB — HM PAP SMEAR: HM PAP: NEGATIVE

## 2015-07-01 DIAGNOSIS — Z0289 Encounter for other administrative examinations: Secondary | ICD-10-CM

## 2015-07-03 ENCOUNTER — Encounter: Payer: Self-pay | Admitting: Nurse Practitioner

## 2015-07-03 ENCOUNTER — Ambulatory Visit (INDEPENDENT_AMBULATORY_CARE_PROVIDER_SITE_OTHER): Payer: BLUE CROSS/BLUE SHIELD | Admitting: Nurse Practitioner

## 2015-07-03 VITALS — BP 128/78 | HR 86 | Ht 62.0 in | Wt 181.6 lb

## 2015-07-03 DIAGNOSIS — Z5181 Encounter for therapeutic drug level monitoring: Secondary | ICD-10-CM | POA: Diagnosis not present

## 2015-07-03 DIAGNOSIS — G40309 Generalized idiopathic epilepsy and epileptic syndromes, not intractable, without status epilepticus: Secondary | ICD-10-CM | POA: Diagnosis not present

## 2015-07-03 MED ORDER — PHENOBARBITAL 100 MG PO TABS: 100.0000 mg | ORAL_TABLET | Freq: Every day | ORAL | Status: DC

## 2015-07-03 NOTE — Progress Notes (Signed)
[facility]  PATIENT: Whitney Hines DOB: June 11, 1950   REASON FOR VISIT:  Follow-up for seizure disorder HISTORY FROM: patient    HISTORY OF PRESENT ILLNESS:[name] , 65 year old female returns for followup. She has a history of seizure disorder with last seizure occurring in 1999. She denies any difficulty tolerating her phenobarbital.. She returns for reevaluation. She needs refills and labs    HISTORY:of seizure disorder and has been well controlled on phenobarbital. Last seizure was in 1999. She denies any difficulty tolerating her medication, no confusion,staring spells, no balance issues, sleep disturbance ,no falls. She continues to work full-time,she is very active and has continued her exercise program.    REVIEW OF SYSTEMS: Full 14 system review of systems performed and notable only for those listed, all others are neg:  Constitutional: neg  Cardiovascular: neg Ear/Nose/Throat: neg  Skin: neg Eyes: neg Respiratory: neg Gastroitestinal: neg  Hematology/Lymphatic: neg  Endocrine: neg Musculoskeletal:neg Allergy/Immunology: neg Neurological: neg Psychiatric: neg Sleep : neg   ALLERGIES: Allergies  Allergen Reactions  . Dilantin [Phenytoin] Hives    HOME MEDICATIONS: Outpatient Prescriptions Prior to Visit  Medication Sig Dispense Refill  . lisinopril-hydrochlorothiazide (PRINZIDE,ZESTORETIC) 20-12.5 MG per tablet Take 1 tablet by mouth daily.    Marland Kitchen PHENObarbital (LUMINAL) 100 MG tablet Take 1 tablet (100 mg total) by mouth at bedtime. 90 tablet 1   No facility-administered medications prior to visit.    PAST MEDICAL HISTORY: Past Medical History  Diagnosis Date  . High blood pressure   . Seizure ([place]) oct 1999    PAST SURGICAL HISTORY: Past Surgical History  Procedure Laterality Date  . Multiple tooth extractions    . Colonscopy  age 67  . Colonoscopy with propofol N/A 01/07/2015    Procedure: COLONOSCOPY WITH PROPOFOL;   Surgeon: Garlan Fair, MD;  Location: [facility];  Service: Endoscopy;  Laterality: N/A;    FAMILY HISTORY: No family history on file.  SOCIAL HISTORY: Social History   Social History  . Marital Status: Married    Spouse Name: N/A  . Number of Children: 0  . Years of Education: BS   Occupational History  . Supervisor     Social History Main Topics  . Smoking status: Never Smoker   . Smokeless tobacco: Never Used  . Alcohol Use: No  . Drug Use: No  . Sexual Activity: Not on file   Other Topics Concern  . 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    Patient has BS degree.    Patient does not have any children.    Patient drinks 8 ounces of caffeine daily.    Patient is left handed.      PHYSICAL EXAM  Filed Vitals:   07/03/15 1311  BP: 128/78  Pulse: 86  Height: 5\' 2"  (1.575 m)  Weight: 181 lb 9.6 oz (82.373 kg)   Body mass index is 33.21 kg/(m^2). Generalized: Well developed, in no acute distress  Head: normocephalic and atraumatic,. Oropharynx benign  Neck: Supple, no carotid bruits  Musculoskeletal: No deformity   Neurological examination   Mentation: Alert oriented to time, place, history taking. Attention span and concentration appropriate. Recent and remote memory intact. Follows all commands speech and language fluent.   Cranial nerve II-XII: Fundoscopic exam reveals sharp disc margins.Pupils were equal round reactive to light extraocular movements were full, visual  field were full on confrontational test. Facial sensation and strength were normal. hearing was intact to finger rubbing bilaterally. Uvula tongue midline. head turning and shoulder shrug were normal and symmetric.Tongue protrusion into cheek strength was normal. Motor: normal bulk and tone, full strength in the BUE, BLE, fine finger movements normal, no pronator drift. No  focal weakness Sensory: normal and symmetric to light touch, pinprick, and Vibration, proprioception  Coordination: finger-nose-finger, heel-to-shin bilaterally, no dysmetria Reflexes: Brachioradialis 2/2, biceps 2/2, triceps 2/2, patellar 2/2, Achilles 2/2, plantar responses were flexor bilaterally. Gait and Station: Rising up from seated position without assistance, normal stance, moderate stride, good arm swing, smooth turning, able to perform tiptoe, and heel walking without difficulty. Tandem gait is steady  DIAGNOSTIC DATA (LABS, IMAGING, TESTING) -  ASSESSMENT AND PLAN 65 y.o. year old female has a past medical history of High blood pressure and Seizure (oct 1999). here to follow-up. Seizure disorder is stable. Concussion at work 2015.  No memory loss associated with this.  PLAN: Continue phenobarbital at current dose Call for any seizure activity Will check labs todayCBC, CMP and PB level Follow up yearly [name], [facility], [facility], [facility] [address], [place], [place] [address] [phone]

## 2015-07-03 NOTE — Patient Instructions (Signed)
Continue phenobarbital at current dose Call for any seizure activity Will check labs today Follow up yearly

## 2015-07-04 ENCOUNTER — Telehealth: Payer: Self-pay

## 2015-07-04 ENCOUNTER — Telehealth: Payer: Self-pay | Admitting: *Deleted

## 2015-07-04 LAB — CBC WITH DIFFERENTIAL/PLATELET
Basophils Absolute: 0 10*3/uL (ref 0.0–0.2)
Basos: 0 %
EOS (ABSOLUTE): 0 10*3/uL (ref 0.0–0.4)
Eos: 0 %
Hematocrit: 36.3 % (ref 34.0–46.6)
Hemoglobin: 12.3 g/dL (ref 11.1–15.9)
Immature Grans (Abs): 0 10*3/uL (ref 0.0–0.1)
Immature Granulocytes: 0 %
Lymphocytes Absolute: 3.6 10*3/uL — ABNORMAL HIGH (ref 0.7–3.1)
Lymphs: 48 %
MCH: 30.4 pg (ref 26.6–33.0)
MCHC: 33.9 g/dL (ref 31.5–35.7)
MCV: 90 fL (ref 79–97)
Monocytes Absolute: 0.4 10*3/uL (ref 0.1–0.9)
Monocytes: 5 %
Neutrophils Absolute: 3.5 10*3/uL (ref 1.4–7.0)
Neutrophils: 47 %
Platelets: 309 10*3/uL (ref 150–379)
RBC: 4.04 x10E6/uL (ref 3.77–5.28)
RDW: 13.9 % (ref 12.3–15.4)
WBC: 7.4 10*3/uL (ref 3.4–10.8)

## 2015-07-04 LAB — PHENOBARBITAL LEVEL: Phenobarbital, Serum: 30 ug/mL (ref 15–40)

## 2015-07-04 LAB — COMPREHENSIVE METABOLIC PANEL
ALBUMIN: 4.2 g/dL (ref 3.6–4.8)
ALK PHOS: 142 IU/L — AB (ref 39–117)
ALT: 17 IU/L (ref 0–32)
AST: 27 IU/L (ref 0–40)
Albumin/Globulin Ratio: 1 — ABNORMAL LOW (ref 1.2–2.2)
BUN / CREAT RATIO: 25 (ref 12–28)
BUN: 27 mg/dL (ref 8–27)
Bilirubin Total: <0.2 mg/dL (ref 0.0–1.2)
CALCIUM: 9.2 mg/dL (ref 8.7–10.3)
CO2: 23 mmol/L (ref 18–29)
CREATININE: 1.1 mg/dL — AB (ref 0.57–1.00)
Chloride: 99 mmol/L (ref 96–106)
GFR, EST AFRICAN AMERICAN: 62 mL/min/{1.73_m2} (ref >59)
GFR, EST NON AFRICAN AMERICAN: 54 mL/min/{1.73_m2} — AB (ref >59)
GLOBULIN, TOTAL: 4.3 g/dL (ref 1.5–4.5)
Glucose: 74 mg/dL (ref 65–99)
Potassium: 4.3 mmol/L (ref 3.5–5.2)
SODIUM: 138 mmol/L (ref 134–144)
Total Protein: 8.5 g/dL (ref 6.0–8.5)

## 2015-07-04 NOTE — Telephone Encounter (Signed)
Fax confirmation for [facility]  [facility] [address]

## 2015-07-04 NOTE — Telephone Encounter (Signed)
Left a detailed message on cell phone (per DPR) explaining that Hoyle Sauer, NP reviewed pt's labs and they are stable. I encouraged pt to call back with any questions or concerns, and gave clinic's phone number.

## 2015-07-04 NOTE — Telephone Encounter (Signed)
-----   Message from [name], NP sent at 07/04/2015  8:23 AM EDT ----- Labs stable please cal the patient

## 2015-12-24 ENCOUNTER — Other Ambulatory Visit: Payer: Self-pay | Admitting: *Deleted

## 2015-12-24 MED ORDER — PHENOBARBITAL 100 MG PO TABS
100.0000 mg | ORAL_TABLET | Freq: Every day | ORAL | 1 refills | Status: DC
Start: 2015-12-24 — End: 2016-06-25

## 2015-12-24 NOTE — Telephone Encounter (Signed)
Received fax confirmation (phenobarbital). [phone] ssy

## 2016-06-25 ENCOUNTER — Other Ambulatory Visit: Payer: Self-pay | Admitting: *Deleted

## 2016-06-25 MED ORDER — PHENOBARBITAL 100 MG PO TABS: 100.0000 mg | ORAL_TABLET | Freq: Every day | ORAL | 1 refills | Status: DC

## 2016-06-25 NOTE — Telephone Encounter (Signed)
Fax confirmation received phenobarbital [phone]. sy

## 2016-07-02 ENCOUNTER — Encounter (INDEPENDENT_AMBULATORY_CARE_PROVIDER_SITE_OTHER): Payer: Self-pay

## 2016-07-02 ENCOUNTER — Encounter: Payer: Self-pay | Admitting: Nurse Practitioner

## 2016-07-02 ENCOUNTER — Ambulatory Visit (INDEPENDENT_AMBULATORY_CARE_PROVIDER_SITE_OTHER): Payer: BLUE CROSS/BLUE SHIELD | Admitting: Nurse Practitioner

## 2016-07-02 VITALS — BP 146/79 | HR 94 | Ht 62.0 in | Wt 188.0 lb

## 2016-07-02 DIAGNOSIS — G40309 Generalized idiopathic epilepsy and epileptic syndromes, not intractable, without status epilepticus: Secondary | ICD-10-CM

## 2016-07-02 DIAGNOSIS — Z5181 Encounter for therapeutic drug level monitoring: Secondary | ICD-10-CM | POA: Diagnosis not present

## 2016-07-02 MED ORDER — PHENOBARBITAL 100 MG PO TABS: 100.0000 mg | ORAL_TABLET | Freq: Every day | ORAL | 1 refills | Status: DC

## 2016-07-02 NOTE — Progress Notes (Signed)
[facility]  PATIENT: Whitney Hines DOB: [birth year]   REASON FOR VISIT:  Follow-up for seizure disorder, history of concussion HISTORY FROM: patient    HISTORY OF PRESENT ILLNESS:[name] , 66 year old female returns for followup. She has a history of seizure disorder with last seizure occurring in 1999. She denies any difficulty tolerating her phenobarbital. She returns for reevaluation. She needs refills and labs . She has continued to gain weight. She was encouraged to follow a Mediterranean diet, and she was encouraged to exercise  HISTORY:of seizure disorder and has been well controlled on phenobarbital. Last seizure was in 1999. She denies any difficulty tolerating her medication, no confusion,staring spells, no balance issues, sleep disturbance ,no falls. She continues to work full-time,she is very active and has continued her exercise program.    REVIEW OF SYSTEMS: Full 14 system review of systems performed and notable only for those listed, all others are neg:  Constitutional: neg  Cardiovascular: neg Ear/Nose/Throat: neg  Skin: neg Eyes: neg Respiratory: neg Gastroitestinal: neg  Hematology/Lymphatic: neg  Endocrine: neg Musculoskeletal:neg Allergy/Immunology: neg Neurological: neg Psychiatric: neg Sleep : neg   ALLERGIES: Allergies  Allergen Reactions  . Dilantin [Phenytoin] Hives    HOME MEDICATIONS: Outpatient Medications Prior to Visit  Medication Sig Dispense Refill  . lisinopril-hydrochlorothiazide (PRINZIDE,ZESTORETIC) 20-12.5 MG per tablet Take 1 tablet by mouth daily.    Marland Kitchen PHENObarbital (LUMINAL) 100 MG tablet Take 1 tablet (100 mg total) by mouth at bedtime. 90 tablet 1   No facility-administered medications prior to visit.     PAST MEDICAL HISTORY: Past Medical History:  Diagnosis Date  . High blood pressure   . Seizure ([place]) oct 1999    PAST SURGICAL HISTORY: Past Surgical History:  Procedure Laterality Date  .  COLONOSCOPY WITH PROPOFOL N/A 01/07/2015   Procedure: COLONOSCOPY WITH PROPOFOL;  Surgeon: Garlan Fair, MD;  Location: [facility];  Service: Endoscopy;  Laterality: N/A;  . colonscopy  age 62  . MULTIPLE TOOTH EXTRACTIONS      FAMILY HISTORY: No family history on file.  SOCIAL HISTORY: Social History   Social History  . Marital status: Married    Spouse name: N/A  . Number of children: 0  . Years of education: BS   Occupational History  . Supervisor     Social History Main Topics  . Smoking status: Never Smoker  . Smokeless tobacco: Never Used  . Alcohol use No  . Drug use: No  . Sexual activity: Not on file   Other Topics Concern  . 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    Patient has BS degree.    Patient does not have any children.    Patient drinks 8 ounces of caffeine daily.    Patient is left handed.      PHYSICAL EXAM  Vitals:   07/02/16 1318  BP: (!) 146/79  Pulse: 94  Weight: 188 lb (85.3 kg)  Height: 5\' 2"  (1.575 m)   Body mass index is 34.39 kg/m. Generalized: Well developed, Obese female in no acute distress  Head: normocephalic and atraumatic,. Oropharynx benign  Neck: Supple,  Musculoskeletal: No deformity   Neurological examination   Mentation: Alert oriented to time, place, history taking. Attention span and concentration appropriate. Recent and remote memory intact. Follows all commands speech and language fluent.   Cranial nerve  II-XII: .Pupils were equal round reactive to light extraocular movements were full, visual field were full on confrontational test. Facial sensation and strength were normal. hearing was intact to finger rubbing bilaterally. Uvula tongue midline. head turning and shoulder shrug were normal and symmetric.Tongue protrusion into cheek strength was normal. Motor: normal bulk and tone, full strength  in the BUE, BLE, fine finger movements normal, no pronator drift. No focal weakness Sensory: normal and symmetric to light touch, pinprick, and Vibration in the upper and lower extremities  Coordination: finger-nose-finger, heel-to-shin bilaterally, no dysmetria Reflexes: Brachioradialis 2/2, biceps 2/2, triceps 2/2, patellar 2/2, Achilles 2/2, plantar responses were flexor bilaterally. Gait and Station: Rising up from seated position without assistance, normal stance, moderate stride, good arm swing, smooth turning, able to perform tiptoe, and heel walking without difficulty. Tandem gait is steady  DIAGNOSTIC DATA (LABS, IMAGING, TESTING) -  ASSESSMENT AND PLAN 66 y.o. year old female has a past medical history of High blood pressure and Seizure (oct 1999). here to follow-up. Seizure disorder is stable. Concussion at work 2015.  No memory loss associated with this.  PLAN: Continue phenobarbital at current dose, will refill Call for any seizure activity Will check labs todayCBC, CMP to monitor adverse effects of phenobarbital  PB level to make sure it is therapeutic Given copy of diet Mediterranean Encouraged exercise to lose weight Follow up yearly Whitney Bible, [facility], [facility], [facility] [address], [place], [place] [address] [phone]

## 2016-07-02 NOTE — Patient Instructions (Addendum)
Continue phenobarbital at current dose Call for any seizure activity Will check labs todayCBC, CMP and PB level Given copy of diet Mediterranean Follow up yearly  Mediterranean Diet A Mediterranean diet refers to food and lifestyle choices that are based on the traditions of countries located on the [employer]. This way of eating has been shown to help prevent certain conditions and improve outcomes for people who have chronic diseases, like kidney disease and heart disease. What are tips for following this plan? Lifestyle   Cook and eat meals together with your family, when possible.  Drink enough fluid to keep your urine clear or pale yellow.  Be physically active every day. This includes:  Aerobic exercise like running or swimming.  Leisure activities like gardening, walking, or housework.  Get 7-8 hours of sleep each night.  If recommended by your health care provider, drink red wine in moderation. This means 1 glass a day for nonpregnant women and 2 glasses a day for men. A glass of wine equals 5 oz (150 mL). Reading food labels   Check the serving size of packaged foods. For foods such as rice and pasta, the serving size refers to the amount of cooked product, not dry.  Check the total fat in packaged foods. Avoid foods that have saturated fat or trans fats.  Check the ingredients list for added sugars, such as corn syrup. Shopping   At the grocery store, buy most of your food from the areas near the walls of the store. This includes:  Fresh fruits and vegetables (produce).  Grains, beans, nuts, and seeds. Some of these may be available in unpackaged forms or large amounts (in bulk).  Fresh seafood.  Poultry and eggs.  Low-fat dairy products.  Buy whole ingredients instead of prepackaged foods.  Buy fresh fruits and vegetables in-season from local farmers markets.  Buy frozen fruits and vegetables in resealable bags.  If you do not have access to  quality fresh seafood, buy precooked frozen shrimp or canned fish, such as tuna, salmon, or sardines.  Buy small amounts of raw or cooked vegetables, salads, or olives from the deli or salad bar at your store.  Stock your pantry so you always have certain foods on hand, such as olive oil, canned tuna, canned tomatoes, rice, pasta, and beans. Cooking   Cook foods with extra-virgin olive oil instead of using butter or other vegetable oils.  Have meat as a side dish, and have vegetables or grains as your main dish. This means having meat in small portions or adding small amounts of meat to foods like pasta or stew.  Use beans or vegetables instead of meat in common dishes like chili or lasagna.  Experiment with different cooking methods. Try roasting or broiling vegetables instead of steaming or sauteing them.  Add frozen vegetables to soups, stews, pasta, or rice.  Add nuts or seeds for added healthy fat at each meal. You can add these to yogurt, salads, or vegetable dishes.  Marinate fish or vegetables using olive oil, lemon juice, garlic, and fresh herbs. Meal planning   Plan to eat 1 vegetarian meal one day each week. Try to work up to 2 vegetarian meals, if possible.  Eat seafood 2 or more times a week.  Have healthy snacks readily available, such as:  Vegetable sticks with hummus.  Greek yogurt.  Fruit and nut trail mix.  Eat balanced meals throughout the week. This includes:  Fruit: 2-3 servings a day  Vegetables: 4-5  servings a day  Low-fat dairy: 2 servings a day  Fish, poultry, or lean meat: 1 serving a day  Beans and legumes: 2 or more servings a week  Nuts and seeds: 1-2 servings a day  Whole grains: 6-8 servings a day  Extra-virgin olive oil: 3-4 servings a day  Limit red meat and sweets to only a few servings a month What are my food choices?  Mediterranean diet  Recommended  Grains: Whole-grain pasta. Brown rice. Bulgar wheat. Polenta. Couscous.  Whole-wheat bread. [place].  Vegetables: Artichokes. Beets. Broccoli. Cabbage. Carrots. Eggplant. Green beans. Chard. Kale. Spinach. Onions. Leeks. Peas. Squash. Tomatoes. Peppers. Radishes.  Fruits: Apples. Apricots. Avocado. Berries. Bananas. Cherries. Dates. Figs. Grapes. Lemons. Melon. Oranges. Peaches. Plums. Pomegranate.  Meats and other protein foods: Beans. Almonds. Sunflower seeds. Pine nuts. Peanuts. [place]. Salmon. Scallops. Shrimp. [place]. Tilapia. Clams. Oysters. Eggs.  Dairy: Low-fat milk. Cheese. Greek yogurt.  Beverages: Water. Red wine. Herbal tea.  Fats and oils: Extra virgin olive oil. Avocado oil. Grape seed oil.  Sweets and desserts: [place] yogurt with honey. Baked apples. Poached pears. Trail mix.  Seasoning and other foods: Basil. Cilantro. Coriander. Cumin. Mint. Parsley. Sage. Rosemary. Tarragon. Garlic. Oregano. Thyme. Pepper. Balsalmic vinegar. Tahini. Hummus. Tomato sauce. Olives. Mushrooms.  Limit these  Grains: Prepackaged pasta or rice dishes. Prepackaged cereal with added sugar.  Vegetables: Deep fried potatoes (french fries).  Fruits: Fruit canned in syrup.  Meats and other protein foods: Beef. Pork. Lamb. Poultry with skin. Hot dogs. Berniece Salines.  Dairy: Ice cream. Sour cream. Whole milk.  Beverages: Juice. Sugar-sweetened soft drinks. Beer. Liquor and spirits.  Fats and oils: Butter. Canola oil. Vegetable oil. Beef fat (tallow). Lard.  Sweets and desserts: Cookies. Cakes. Pies. Candy.  Seasoning and other foods: Mayonnaise. Premade sauces and marinades.  The items listed may not be a complete list. Talk with your dietitian about what dietary choices are right for you. Summary  The Mediterranean diet includes both food and lifestyle choices.  Eat a variety of fresh fruits and vegetables, beans, nuts, seeds, and whole grains.  Limit the amount of red meat and sweets that you eat.  Talk with your health care provider about whether it is safe  for you to drink red wine in moderation. This means 1 glass a day for nonpregnant women and 2 glasses a day for men. A glass of wine equals 5 oz (150 mL). This information is not intended to replace advice given to you by your health care provider. Make sure you discuss any questions you have with your health care provider. Document Released: 10/03/2015 Document Revised: 11/05/2015 Document Reviewed: 10/03/2015 Elsevier Interactive Patient Education  2017 [employer].

## 2016-07-02 NOTE — Progress Notes (Signed)
I have read the note, and I agree with the clinical assessment and plan.  Maejor Erven KEITH   

## 2016-07-02 NOTE — Progress Notes (Signed)
Fax confirmation received phenobarbital [facility] [phone]. sy

## 2016-07-03 ENCOUNTER — Telehealth: Payer: Self-pay | Admitting: *Deleted

## 2016-07-03 LAB — CBC WITH DIFFERENTIAL/PLATELET
BASOS: 0 %
Basophils Absolute: 0 10*3/uL (ref 0.0–0.2)
EOS (ABSOLUTE): 0 10*3/uL (ref 0.0–0.4)
Eos: 0 %
Hematocrit: 36.5 % (ref 34.0–46.6)
Hemoglobin: 12 g/dL (ref 11.1–15.9)
Immature Grans (Abs): 0 10*3/uL (ref 0.0–0.1)
Immature Granulocytes: 0 %
LYMPHS ABS: 3.3 10*3/uL — AB (ref 0.7–3.1)
Lymphs: 48 %
MCH: 29.6 pg (ref 26.6–33.0)
MCHC: 32.9 g/dL (ref 31.5–35.7)
MCV: 90 fL (ref 79–97)
Monocytes Absolute: 0.2 10*3/uL (ref 0.1–0.9)
Monocytes: 3 %
NEUTROS ABS: 3.4 10*3/uL (ref 1.4–7.0)
Neutrophils: 49 %
PLATELETS: 296 10*3/uL (ref 150–379)
RBC: 4.05 x10E6/uL (ref 3.77–5.28)
RDW: 14.8 % (ref 12.3–15.4)
WBC: 6.9 10*3/uL (ref 3.4–10.8)

## 2016-07-03 LAB — COMPREHENSIVE METABOLIC PANEL
A/G RATIO: 0.9 — AB (ref 1.2–2.2)
ALT: 15 IU/L (ref 0–32)
AST: 22 IU/L (ref 0–40)
Albumin: 4.1 g/dL (ref 3.6–4.8)
Alkaline Phosphatase: 135 IU/L — ABNORMAL HIGH (ref 39–117)
BUN/Creatinine Ratio: 19 (ref 12–28)
BUN: 21 mg/dL (ref 8–27)
CALCIUM: 9.3 mg/dL (ref 8.7–10.3)
CO2: 25 mmol/L (ref 18–29)
Chloride: 100 mmol/L (ref 96–106)
Creatinine, Ser: 1.13 mg/dL — ABNORMAL HIGH (ref 0.57–1.00)
GFR calc Af Amer: 59 mL/min/{1.73_m2} — ABNORMAL LOW (ref >59)
GFR, EST NON AFRICAN AMERICAN: 51 mL/min/{1.73_m2} — AB (ref >59)
Globulin, Total: 4.4 g/dL (ref 1.5–4.5)
Glucose: 93 mg/dL (ref 65–99)
POTASSIUM: 4 mmol/L (ref 3.5–5.2)
Sodium: 140 mmol/L (ref 134–144)
TOTAL PROTEIN: 8.5 g/dL (ref 6.0–8.5)

## 2016-07-03 LAB — PHENOBARBITAL LEVEL: Phenobarbital, Serum: 28 ug/mL (ref 15–40)

## 2016-07-03 NOTE — Telephone Encounter (Signed)
-----   Message from [name], NP sent at 07/03/2016  7:56 AM EDT ----- Labs okay except kidney function mildly elevated. Please send results to primary care

## 2016-07-03 NOTE — Telephone Encounter (Signed)
Spoke to pt and relayed the results of her labs.  Confirmed her pcp, Dr. Billey Chang. She verbalized understanding.

## 2017-02-25 ENCOUNTER — Ambulatory Visit (INDEPENDENT_AMBULATORY_CARE_PROVIDER_SITE_OTHER): Payer: BLUE CROSS/BLUE SHIELD | Admitting: Family Medicine

## 2017-02-25 ENCOUNTER — Encounter: Payer: Self-pay | Admitting: Family Medicine

## 2017-02-25 VITALS — BP 130/80 | HR 73 | Temp 98.2°F | Ht 61.25 in | Wt 178.0 lb

## 2017-02-25 DIAGNOSIS — I10 Essential (primary) hypertension: Secondary | ICD-10-CM | POA: Diagnosis not present

## 2017-02-25 DIAGNOSIS — G40309 Generalized idiopathic epilepsy and epileptic syndromes, not intractable, without status epilepticus: Secondary | ICD-10-CM

## 2017-02-25 DIAGNOSIS — D126 Benign neoplasm of colon, unspecified: Secondary | ICD-10-CM

## 2017-02-25 DIAGNOSIS — Z23 Encounter for immunization: Secondary | ICD-10-CM

## 2017-02-25 DIAGNOSIS — M1731 Unilateral post-traumatic osteoarthritis, right knee: Secondary | ICD-10-CM | POA: Insufficient documentation

## 2017-02-25 DIAGNOSIS — Z6830 Body mass index (BMI) 30.0-30.9, adult: Secondary | ICD-10-CM

## 2017-02-25 DIAGNOSIS — E786 Lipoprotein deficiency: Secondary | ICD-10-CM | POA: Insufficient documentation

## 2017-02-25 DIAGNOSIS — Z0000 Encounter for general adult medical examination without abnormal findings: Secondary | ICD-10-CM | POA: Diagnosis not present

## 2017-02-25 LAB — CBC WITH DIFFERENTIAL/PLATELET
BASOS PCT: 0.2 % (ref 0.0–3.0)
Basophils Absolute: 0 10*3/uL (ref 0.0–0.1)
Eosinophils Absolute: 0 10*3/uL (ref 0.0–0.7)
Eosinophils Relative: 0 % (ref 0.0–5.0)
HCT: 37.9 % (ref 36.0–46.0)
HEMOGLOBIN: 12.4 g/dL (ref 12.0–15.0)
LYMPHS ABS: 2.5 10*3/uL (ref 0.7–4.0)
Lymphocytes Relative: 37.1 % (ref 12.0–46.0)
MCHC: 32.7 g/dL (ref 30.0–36.0)
MCV: 94 fl (ref 78.0–100.0)
MONOS PCT: 4.3 % (ref 3.0–12.0)
Monocytes Absolute: 0.3 10*3/uL (ref 0.1–1.0)
NEUTROS PCT: 58.4 % (ref 43.0–77.0)
Neutro Abs: 3.9 10*3/uL (ref 1.4–7.7)
Platelets: 287 10*3/uL (ref 150.0–400.0)
RBC: 4.03 Mil/uL (ref 3.87–5.11)
RDW: 13.9 % (ref 11.5–15.5)
WBC: 6.7 10*3/uL (ref 4.0–10.5)

## 2017-02-25 LAB — COMPREHENSIVE METABOLIC PANEL
ALBUMIN: 4 g/dL (ref 3.5–5.2)
ALK PHOS: 129 U/L — AB (ref 39–117)
ALT: 18 U/L (ref 0–35)
AST: 21 U/L (ref 0–37)
BUN: 20 mg/dL (ref 6–23)
CO2: 29 mEq/L (ref 19–32)
Calcium: 9.3 mg/dL (ref 8.4–10.5)
Chloride: 103 mEq/L (ref 96–112)
Creatinine, Ser: 1.19 mg/dL (ref 0.40–1.20)
GFR: 58.59 mL/min — AB (ref >60.00)
Glucose, Bld: 83 mg/dL (ref 70–99)
POTASSIUM: 4.3 meq/L (ref 3.5–5.1)
Sodium: 138 mEq/L (ref 135–145)
TOTAL PROTEIN: 8.2 g/dL (ref 6.0–8.3)
Total Bilirubin: 0.4 mg/dL (ref 0.2–1.2)

## 2017-02-25 LAB — LIPID PANEL
CHOL/HDL RATIO: 6
Cholesterol: 177 mg/dL (ref 0–200)
HDL: 28.1 mg/dL — ABNORMAL LOW (ref >39.00)
NONHDL: 148.67
Triglycerides: 261 mg/dL — ABNORMAL HIGH (ref 0.0–149.0)
VLDL: 52.2 mg/dL — ABNORMAL HIGH (ref 0.0–40.0)

## 2017-02-25 LAB — LDL CHOLESTEROL, DIRECT: LDL DIRECT: 87 mg/dL

## 2017-02-25 MED ORDER — LISINOPRIL-HYDROCHLOROTHIAZIDE 20-12.5 MG PO TABS: 1.0000 | ORAL_TABLET | Freq: Every day | ORAL | 3 refills | Status: DC

## 2017-02-25 NOTE — Progress Notes (Signed)
Subjective  Chief Complaint  Patient presents with  . Establish Care    Transfer from [place], Not Fasting  . Annual Exam    HPI: Whitney Hines is a 67 y.o. female who presents to [place] at [facility] today for a Female Wellness Visit. She also has the concerns and/or needs as listed above in the chief complaint. These will be addressed in addition to the Health Maintenance Visit.   Wellness Visit: annual visit with health maintenance review and exam without Pap   67 year old female doing very well here for wellness exam and follow-up chronic medical problems.  Her weight is down 10 pounds since May.  She is eating healthier.  Limited exercise.  She is due for her mammogram and gets this at the breast center.  Colonoscopy will be due at the end of this year due to adenomatous polyp.  Pap smears are up-to-date.  Flu shot today Lifestyle: Body mass index is 33.36 kg/m. Wt Readings from Last 3 Encounters:  02/25/17 178 lb (80.7 kg)  07/02/16 188 lb (85.3 kg)  07/03/15 181 lb 9.6 oz (82.4 kg)   BP Readings from Last 3 Encounters:  02/25/17 130/80  07/02/16 (!) 146/79  07/03/15 128/78    Diet: low fat Exercise: intermittently, walking  Chronic disease management visit and/or acute problem visit:  Hypertension follow-up: Blood pressure has been normal.  Tolerating medications without adverse effects compliance is good.  No chest pain shortness of breath, lower extremity edema.  Due for lab work  Seizure disorder is well controlled on medications.  Sees neurology annually  She has arthritis of the right knee that bothers her while she climbs stairs.  Otherwise asymptomatic.  Hyperlipidemia with low HDL and elevated cardiovascular risk score not on medications.  Due for recheck today.  She is not fasting.  Recommend starting statin if remains elevated.   Patient Active Problem List   Diagnosis Date Noted  . Tubular adenoma of colon 01/10/2015    Priority:  High  . BMI 30.0-30.9,adult 12/31/2014    Priority: High  . Essential hypertension 12/31/2014    Priority: High  . Generalized idiopathic epilepsy and epileptic syndromes, without status epilepticus, not intractable ([place]) 05/15/2013    Priority: High  . Low HDL (under 40) 02/25/2017  . Unilateral post-traumatic osteoarthritis, right knee 02/25/2017   Health Maintenance  Topic Date Due  . Hepatitis C Screening  [birth year]  . HIV Screening  05/23/1965  . MAMMOGRAM  05/03/2013  . COLONOSCOPY  01/06/2018  . PAP SMEAR  05/20/2020  . TETANUS/TDAP  12/30/2024  . INFLUENZA VACCINE  Completed   Immunization History  Administered Date(s) Administered  . Influenza, Seasonal, Injecte, Preservative Fre 12/31/2014  . Influenza,inj,Quad PF,6+ Mos 11/21/2015, 02/25/2017  . Tdap 12/31/2014  . Zoster 02/22/2015   We updated and reviewed the patient's past history in detail and it is documented below. Allergies: Patient is allergic to dilantin [phenytoin]. Past Medical History Patient  has a past medical history of High blood pressure, Neuropathy, and Seizure ([place]) (oct 1999). Past Surgical History Patient  has a past surgical history that includes Multiple tooth extractions; colonscopy (age 61); and Colonoscopy with propofol (N/A, 01/07/2015). Family History: Patient family history includes Heart attack in her mother; Heart disease in her mother; Hypertension in her father; Stroke in her paternal aunt, paternal aunt, and paternal uncle. Social History:  Patient  reports that  has never smoked. she has never used smokeless tobacco. She reports that she does  not drink alcohol or use drugs.  Review of Systems: Constitutional: negative for fever or malaise Ophthalmic: negative for photophobia, double vision or loss of vision Cardiovascular: negative for chest pain, dyspnea on exertion, or new LE swelling Respiratory: negative for SOB or persistent cough Gastrointestinal: negative for abdominal  pain, change in bowel habits or melena Genitourinary: negative for dysuria or gross hematuria, no abnormal uterine bleeding or disharge Musculoskeletal: negative for new gait disturbance or muscular weakness Integumentary: negative for new or persistent rashes, no breast lumps Neurological: negative for TIA or stroke symptoms Psychiatric: negative for SI or delusions Allergic/Immunologic: negative for hives  Patient Care Team    Relationship Specialty Notifications Start End  Leamon Arnt, MD PCP - General Family Medicine  01/07/15     Objective  Vitals: BP 130/80 (BP Location: Left Arm, Patient Position: Sitting, Cuff Size: Large)   Pulse 73   Temp 98.2 F (36.8 C) (Oral)   Ht 5' 1.25" (1.556 m)   Wt 178 lb (80.7 kg)   LMP  (LMP Unknown) Comment: LMP 2004  SpO2 97%   BMI 33.36 kg/m  General:  Well developed, well nourished, no acute distress  Psych:  Alert and orientedx3,normal mood and affect HEENT:  Normocephalic, atraumatic, non-icteric sclera, PERRL, oropharynx is clear without mass or exudate, supple neck without adenopathy, mass or thyromegaly Cardiovascular:  Normal S1, S2, RRR without gallop, rub or murmur, nondisplaced PMI Respiratory:  Good breath sounds bilaterally, CTAB with normal respiratory effort Gastrointestinal: normal bowel sounds, soft, non-tender, no noted masses. No HSM MSK: no deformities, contusions. Joints are without erythema or swelling. Spine and CVA region are nontender Skin:  Warm, no rashes or suspicious lesions noted Neurologic:    Mental status is normal. CN 2-11 are normal. Gross motor and sensory exams are normal. Normal gait. No tremor Breast Exam: No mass, skin retraction or nipple discharge is appreciated in either breast. No axillary adenopathy. Fibrocystic changes are not noted  Assessment  1. Annual physical exam   2. BMI 30.0-30.9,adult   3. Tubular adenoma of colon   4. Essential hypertension   5. Low HDL (under 40)   6. 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 Included topics are cancer screening recommendations, ways to keep healthy (see AVS) including dietary and exercise recommendations, regular eye and dental care, use of seat belts, and avoidance of moderate alcohol use and tobacco use.  Mammogram at breast center ordered.  Colorectal cancer screening will be due in November of this year.  HIV and hep C screening done today.  BMI: discussed patient's BMI and encouraged positive lifestyle modifications to help get to or maintain a target BMI.  Continue healthy eating and weight loss.  Increase exercise.  HM needs and immunizations were addressed and ordered. See below for orders. See HM and immunization section for updates.  Influenza vaccination given today  Routine labs and screening tests ordered including cmp, cbc and lipids where appropriate.  Discussed recommendations regarding Vit D and calcium supplementation (see AVS)  Chronic disease f/u and/or acute problem visit: (deemed necessary to be done in addition to the wellness visit):  Hypertension is well controlled.  Continue current medications.  Refilled.  Check electrolytes and sodium/potassium.  Check urine at next visit; pt unable to void today  Seizures are well controlled  per neurology  Dyslipidemia with low HDL: Recheck labs today and add medications if needed.  Continue low-fat diet  Follow up: 6 months for blood pressure recheck   Commons side effects, risks, benefits, and alternatives for medications and treatment plan prescribed today were discussed, and the patient expressed understanding of the given instructions. Patient is instructed to call or message via MyChart if he/she has any questions or concerns regarding our treatment plan. No  barriers to understanding were identified. We discussed Red Flag symptoms and signs in detail. Patient expressed understanding regarding what to do in case of urgent or emergency type symptoms.   Medication list was reconciled, printed and provided to the patient in AVS. Patient instructions and summary information was reviewed with the patient as documented in the AVS. This note was prepared with assistance of Dragon voice recognition software. Occasional wrong-word or sound-a-like substitutions may have occurred due to the inherent limitations of voice recognition software  Orders Placed This Encounter  Procedures  . MM DIGITAL SCREENING BILATERAL  . Flu Vaccine QUAD 36+ mos IM  . HIV antibody  . Hepatitis C antibody  . CBC with Differential/Platelet  . Comprehensive metabolic panel  . Lipid panel   Meds ordered this encounter  Medications  . lisinopril-hydrochlorothiazide (PRINZIDE,ZESTORETIC) 20-12.5 MG tablet    Sig: Take 1 tablet by mouth daily.    Dispense:  90 tablet    Refill:  3

## 2017-02-25 NOTE — Patient Instructions (Addendum)
It was so good seeing you again! Thank you for establishing with my new practice and allowing me to continue caring for you. It means a lot to me.   Please schedule a follow up appointment with me in 6 months for blood pressure follow up.  I will release your lab results to you on your MyChart account with further instructions. Please reply with any questions.   Please do these things to maintain good health!   Exercise at least 30-45 minutes a day,  4-5 days a week.   Eat a low-fat diet with lots of fruits and vegetables, up to 7-9 servings per day.  Drink plenty of water daily. Try to drink 8 8oz glasses per day.  Seatbelts can save your life. Always wear your seatbelt.  Place Smoke Detectors on every level of your home and check batteries every year.  Schedule an appointment with an eye doctor for an eye exam every 1-2 years  Safe sex - use condoms to protect yourself from STDs if you could be exposed to these types of infections. Use birth control if you do not want to become pregnant and are sexually active.  Avoid heavy alcohol use. If you drink, keep it to less than 2 drinks/day and not every day.  [place].  Choose someone you trust that could speak for you if you became unable to speak for yourself.  Depression is common in our stressful world.If you're feeling down or losing interest in things you normally enjoy, please come in for a visit.  If anyone is threatening or hurting you, please get help. Physical or Emotional Violence is never OK.    Calcium Intake Recommendations You can take Caltrate Plus twice a day or get it through your diet or other OTC supplements (Viactiv, OsCal etc)  Calcium is a mineral that affects many functions in the body, including:  Blood clotting.  Blood vessel function.  Nerve impulse conduction.  Hormone secretion.  Muscle contraction.  Bone and teeth functions.  Most of your body's calcium supply is stored  in your bones and teeth. When your calcium stores are low, you may be at risk for low bone mass, bone loss, and bone fractures. Consuming enough calcium helps to grow healthy bones and teeth and to prevent breakdown over time. It is very important that you get enough calcium if you are:  A child undergoing rapid growth.  An adolescent girl.  A pre- or post-menopausal woman.  A woman whose menstrual cycle has stopped due to anorexia nervosa or regular intense exercise.  An individual with lactose intolerance or a milk allergy.  A vegetarian.  What is my plan? Try to consume the recommended amount of calcium daily based on your age. Depending on your overall health, your health care provider may recommend increased calcium intake.General daily calcium intake recommendations by age are:  Birth to 6 months: 200 mg.  Infants 7 to 12 months: 260 mg.  Children 1 to 3 years: 700 mg.  Children 4 to 8 years: 1,000 mg.  Children 9 to 13 years: 1,300 mg.  Teens 14 to 18 years: 1,300 mg.  Adults 19 to 50 years: 1,000 mg.  Adult women 51 to 70 years: 1,200 mg.  Adult men 51 to 70 years: 1,000 mg.  Adults 71 years and older: 1,200 mg.  Pregnant and breastfeeding teens: 1,300 mg.  Pregnant and breastfeeding adults: 1,000 mg.  What do I need to know about calcium intake?  In order for the body to absorb calcium, it needs vitamin D. You can get vitamin D through (we recommend getting [id number] units of Vitamin D daily) ? Direct exposure of the skin to sunlight. ? Foods, such as egg yolks, liver, saltwater fish, and fortified milk. ? Supplements.  Consuming too much calcium may cause: ? Constipation. ? Decreased absorption of iron and zinc. ? Kidney stones.  Calcium supplements may interact with certain medicines. Check with your health care provider before starting any calcium supplements.  Try to get most of your calcium from food. What foods can I eat? Grains  Fortified  oatmeal. Fortified ready-to-eat cereals. Fortified frozen waffles. Vegetables Turnip greens. Broccoli. Fruits Fortified orange juice. Meats and Other Protein Sources Canned sardines with bones. Canned salmon with bones. Soy beans. Tofu. Baked beans. Almonds. [place] nuts. Sunflower seeds. Dairy Milk. Yogurt. Cheese. Cottage cheese. Beverages Fortified soy milk. Fortified rice milk. Sweets/Desserts Pudding. Ice Cream. Milkshakes. Blackstrap molasses. The items listed above may not be a complete list of recommended foods or beverages. Contact your dietitian for more options. What foods can affect my calcium intake? It may be more difficult for your body to use calcium or calcium may leave your body more quickly if you consume large amounts of:  Sodium.  Protein.  Caffeine.  Alcohol.  This information is not intended to replace advice given to you by your health care provider. Make sure you discuss any questions you have with your health care provider. Document Released: 09/24/2003 Document Revised: 08/30/2015 Document Reviewed: 07/18/2013 Elsevier Interactive Patient Education  2018 [employer].

## 2017-02-26 ENCOUNTER — Other Ambulatory Visit: Payer: Self-pay | Admitting: Family Medicine

## 2017-02-26 DIAGNOSIS — Z139 Encounter for screening, unspecified: Secondary | ICD-10-CM

## 2017-02-26 LAB — HEPATITIS C ANTIBODY
Hepatitis C Ab: NONREACTIVE
SIGNAL TO CUT-OFF: 0.04 (ref <1.00)

## 2017-02-26 LAB — HIV ANTIBODY (ROUTINE TESTING W REFLEX): HIV: NONREACTIVE

## 2017-03-01 ENCOUNTER — Encounter: Payer: Self-pay | Admitting: Family Medicine

## 2017-03-01 NOTE — Progress Notes (Signed)
Lab results mailed to patient in letter. Normal results. No action / follow up needed on these results.

## 2017-03-01 NOTE — Progress Notes (Signed)
I have reviewed results. Normal with low HDL. Discussed diet and exercise.  Patient notified by letter. Please see letter for details.

## 2017-03-05 ENCOUNTER — Ambulatory Visit: Payer: Self-pay | Admitting: Family Medicine

## 2017-03-22 ENCOUNTER — Ambulatory Visit
Admission: RE | Admit: 2017-03-22 | Discharge: 2017-03-22 | Disposition: A | Discharge status: Home / Self Care | Payer: BLUE CROSS/BLUE SHIELD | Source: Ambulatory Visit | Attending: Family Medicine | Admitting: Family Medicine

## 2017-03-22 DIAGNOSIS — Z139 Encounter for screening, unspecified: Secondary | ICD-10-CM

## 2017-04-04 ENCOUNTER — Encounter: Payer: Self-pay | Admitting: Nurse Practitioner

## 2017-04-05 ENCOUNTER — Telehealth: Payer: Self-pay | Admitting: *Deleted

## 2017-04-05 MED ORDER — PHENOBARBITAL 100 MG PO TABS: 100.0000 mg | ORAL_TABLET | Freq: Every day | ORAL | 1 refills | Status: DC

## 2017-04-05 NOTE — Telephone Encounter (Signed)
CM out will send to CW/MD for singature.

## 2017-04-06 MED ORDER — PHENOBARBITAL 100 MG PO TABS: 100.0000 mg | ORAL_TABLET | Freq: Every day | ORAL | 1 refills | Status: DC

## 2017-04-06 NOTE — Addendum Note (Signed)
Addended by: Brandon Melnick on: 04/06/2017 08:24 AM   Modules accepted: Orders

## 2017-04-06 NOTE — Telephone Encounter (Signed)
Faxed printed/signed rx phenobarbital to [facility] at [phone]. Received fax confirmation.

## 2017-04-06 NOTE — Telephone Encounter (Signed)
Printed rx phenobarbital. Waiting on MD signature, then will fax to pharmacy

## 2017-04-06 NOTE — Addendum Note (Signed)
Addended by: Hope Pigeon on: 04/06/2017 08:41 AM   Modules accepted: Orders

## 2017-07-01 ENCOUNTER — Telehealth: Payer: Self-pay | Admitting: Nurse Practitioner

## 2017-07-01 NOTE — Telephone Encounter (Signed)
Pt has her 1 year f/u rescheduled for 09-11, she is requesting a refill for PHENObarbital (LUMINAL) 100 MG tablet please send to  [id number] [address], [place]. [phone] (Phone) [phone] (Fax)

## 2017-07-02 ENCOUNTER — Other Ambulatory Visit: Payer: Self-pay | Admitting: Nurse Practitioner

## 2017-07-02 MED ORDER — PHENOBARBITAL 100 MG PO TABS: 100.0000 mg | ORAL_TABLET | Freq: Every day | ORAL | 1 refills | Status: DC

## 2017-07-05 ENCOUNTER — Ambulatory Visit: Payer: BLUE CROSS/BLUE SHIELD | Admitting: Nurse Practitioner

## 2017-08-16 ENCOUNTER — Ambulatory Visit: Payer: BLUE CROSS/BLUE SHIELD | Admitting: Family Medicine

## 2017-08-16 ENCOUNTER — Encounter: Payer: Self-pay | Admitting: Family Medicine

## 2017-08-16 ENCOUNTER — Other Ambulatory Visit: Payer: Self-pay

## 2017-08-16 VITALS — BP 120/80 | HR 67 | Temp 98.1°F | Resp 16 | Ht 61.25 in | Wt 183.8 lb

## 2017-08-16 DIAGNOSIS — I10 Essential (primary) hypertension: Secondary | ICD-10-CM

## 2017-08-16 DIAGNOSIS — D126 Benign neoplasm of colon, unspecified: Secondary | ICD-10-CM

## 2017-08-16 DIAGNOSIS — Z23 Encounter for immunization: Secondary | ICD-10-CM | POA: Diagnosis not present

## 2017-08-16 DIAGNOSIS — Z6830 Body mass index (BMI) 30.0-30.9, adult: Secondary | ICD-10-CM | POA: Diagnosis not present

## 2017-08-16 NOTE — Progress Notes (Signed)
Subjective  CC:  Chief Complaint  Patient presents with  . Hypertension    Doing well    HPI: Whitney Hines is a 67 y.o. female who presents to the office today to address the problems listed above in the chief complaint.  Hypertension f/u: Control is good . Pt reports she is doing well. taking medications as instructed, no medication side effects noted, no TIAs, no chest pain on exertion, no dyspnea on exertion, no swelling of ankles. She did note that she was sob while walking [place] She admits she gets very little physcial activity. She does have a treadmill and exercise bike in her living room. She denies adverse effects from his BP medications. Compliance with medication is good.   HM: turned 67: now due prevnar, dexa and due for colon cancer surveillance in November.   Assessment  1. Essential hypertension   2. BMI 30.0-30.9,adult   3. Tubular adenoma of colon      Plan    Hypertension f/u: BP control is well controlled. Continue meds. Recommended to increase exercise and discussed strategy. Keep low salt diet  Hyperlipidemia f/u: at goal  prevnar updated today.  Will order dexa with her next mammo in jan 2020  Should schedule her colonoscopy in November.   Education regarding management of these chronic disease states was given. Management strategies discussed on successive visits include dietary and exercise recommendations, goals of achieving and maintaining IBW, and lifestyle modifications aiming for adequate sleep and minimizing stressors.   Follow up: Return in about 6 months (around 02/15/2018) for complete physical.  Orders Placed This Encounter  Procedures  . Pneumococcal conjugate vaccine 13-valent   No orders of the defined types were placed in this encounter.     BP Readings from Last 3 Encounters:  08/16/17 120/80  02/25/17 130/80  07/02/16 (!) 146/79   Wt Readings from Last 3 Encounters:  08/16/17 183 lb 12.8 oz  (83.4 kg)  02/25/17 178 lb (80.7 kg)  07/02/16 188 lb (85.3 kg)    Lab Results  Component Value Date   CHOL 177 02/25/2017   Lab Results  Component Value Date   HDL 28.10 (L) 02/25/2017   No results found for: [facility] Lab Results  Component Value Date   TRIG 261.0 (H) 02/25/2017   Lab Results  Component Value Date   CHOLHDL 6 02/25/2017   Lab Results  Component Value Date   LDLDIRECT 87.0 02/25/2017   Lab Results  Component Value Date   CREATININE 1.19 02/25/2017   BUN 20 02/25/2017   NA 138 02/25/2017   K 4.3 02/25/2017   CL 103 02/25/2017   CO2 29 02/25/2017    The 10-year ASCVD risk score Mikey Bussing DC Jr., et al., 2013) is: 8.2%   Values used to calculate the score:     Age: 48 years     Sex: Female     Is Non-Hispanic African American: Yes     Diabetic: No     Tobacco smoker: No     Systolic Blood Pressure: 244 mmHg     Is BP treated: Yes     HDL Cholesterol: 28.1 mg/dL     Total Cholesterol: 177 mg/dL  I reviewed the patients updated PMH, FH, and SocHx.    Patient Active Problem List   Diagnosis Date Noted  . Tubular adenoma of colon 01/10/2015    Priority: High  . BMI 30.0-30.9,adult 12/31/2014    Priority:  High  . Essential hypertension 12/31/2014    Priority: High  . Generalized idiopathic epilepsy and epileptic syndromes, without status epilepticus, not intractable ([place]) 05/15/2013    Priority: High  . Low HDL (under 40) 02/25/2017  . Unilateral post-traumatic osteoarthritis, right knee 02/25/2017    Allergies: Dilantin  [phenytoin sodium extended] and Dilantin [phenytoin]  Social History: Patient  reports that she has never smoked. She has never used smokeless tobacco. She reports that she does not drink alcohol or use drugs.  Current Meds  Medication Sig  . lisinopril-hydrochlorothiazide (PRINZIDE,ZESTORETIC) 20-12.5 MG tablet Take 1 tablet by mouth daily.  Marland Kitchen PHENObarbital (LUMINAL) 100 MG tablet Take 1 tablet (100 mg total) by mouth at  bedtime.    Review of Systems: Cardiovascular: negative for chest pain, palpitations, leg swelling, orthopnea Respiratory: negative for SOB, wheezing or persistent cough Gastrointestinal: negative for abdominal pain Genitourinary: negative for dysuria or gross hematuria  Objective  Vitals: BP 120/80   Pulse 67   Temp 98.1 F (36.7 C) (Oral)   Resp 16   Ht 5' 1.25" (1.556 m)   Wt 183 lb 12.8 oz (83.4 kg)   LMP  (LMP Unknown) Comment: LMP 2004  SpO2 99%   BMI 34.45 kg/m  General: no acute distress  Psych:  Alert and oriented, normal mood and affect HEENT:  Normocephalic, atraumatic, supple neck  Cardiovascular:  RRR without murmur. Trace pedal edema Respiratory:  Good breath sounds bilaterally, CTAB with normal respiratory effort Skin:  Warm, no rashes Neurologic:   Mental status is normal  Commons side effects, risks, benefits, and alternatives for medications and treatment plan prescribed today were discussed, and the patient expressed understanding of the given instructions. Patient is instructed to call or message via MyChart if he/she has any questions or concerns regarding our treatment plan. No barriers to understanding were identified. We discussed Red Flag symptoms and signs in detail. Patient expressed understanding regarding what to do in case of urgent or emergency type symptoms.   Medication list was reconciled, printed and provided to the patient in AVS. Patient instructions and summary information was reviewed with the patient as documented in the AVS. This note was prepared with assistance of Dragon voice recognition software. Occasional wrong-word or sound-a-like substitutions may have occurred due to the inherent limitations of voice recognition software

## 2017-08-16 NOTE — Patient Instructions (Signed)
Please return in 6 months for your annual complete physical; please come fasting.   If you have any questions or concerns, please don't hesitate to send me a message via MyChart or call the office at [phone]. Thank you for visiting with [place] today! It's our pleasure caring for you.  We updated your Prevnar vaccination today.   Get moving! Commit to 10 minutes per day to start.   Your blood pressure looks great. Keep a low salt diet.   You should be getting a reminder to get your colonoscopy soon. It is due in November.

## 2017-08-23 ENCOUNTER — Ambulatory Visit: Payer: Self-pay | Admitting: Family Medicine

## 2017-10-18 ENCOUNTER — Encounter: Payer: Self-pay | Admitting: Family Medicine

## 2017-11-02 NOTE — Progress Notes (Signed)
[facility]  PATIENT: ANTASIA HAIDER DOB: 1950/05/11   REASON FOR VISIT:  Follow-up for seizure disorder, history of concussion HISTORY FROM: patient    HISTORY OF PRESENT ILLNESS:[name] , 67 year old female returns for followup. She has a history of seizure disorder with last seizure occurring in 1999. She denies any difficulty tolerating her phenobarbital. She returns for reevaluation. She needs refills and labs . She has lost 1 lb in the last year, but B/P in better controlled.  She continues to work full-time at [employer].  HISTORY:of seizure disorder and has been well controlled on phenobarbital. Last seizure was in 1999. She denies any difficulty tolerating her medication, no confusion,staring spells, no balance issues, sleep disturbance ,no falls. She continues to work full-time,she is very active and has continued her exercise program.    REVIEW OF SYSTEMS: Full 14 system review of systems performed and notable only for those listed, all others are neg:  Constitutional: neg  Cardiovascular: neg Ear/Nose/Throat: neg  Skin: neg Eyes: neg Respiratory: neg Gastroitestinal: neg  Hematology/Lymphatic: neg  Endocrine: neg Musculoskeletal:neg Allergy/Immunology: neg Neurological: History of seizure disorder Psychiatric: neg Sleep : neg   ALLERGIES: Allergies  Allergen Reactions  . Dilantin  [Phenytoin Sodium Extended] Hives  . Dilantin [Phenytoin] Hives    HOME MEDICATIONS: Outpatient Medications Prior to Visit  Medication Sig Dispense Refill  . lisinopril-hydrochlorothiazide (PRINZIDE,ZESTORETIC) 20-12.5 MG tablet Take 1 tablet by mouth daily. 90 tablet 3  . PHENObarbital (LUMINAL) 100 MG tablet Take 1 tablet (100 mg total) by mouth at bedtime. 90 tablet 1   No facility-administered medications prior to visit.     PAST MEDICAL HISTORY: Past Medical History:  Diagnosis Date  . High blood pressure   . Neuropathy   . Seizure ([place])  oct 1999    PAST SURGICAL HISTORY: Past Surgical History:  Procedure Laterality Date  . COLONOSCOPY WITH PROPOFOL N/A 01/07/2015   Procedure: COLONOSCOPY WITH PROPOFOL;  Surgeon: Garlan Fair, MD;  Location: [facility];  Service: Endoscopy;  Laterality: N/A;  . MULTIPLE TOOTH EXTRACTIONS      FAMILY HISTORY: Family History  Problem Relation Age of Onset  . Heart disease Mother   . Heart attack Mother   . Hypertension Father   . Stroke Paternal Aunt   . Stroke Paternal Uncle   . Stroke Paternal Aunt     SOCIAL HISTORY: Social History   Socioeconomic History  . Marital status: Married    Spouse name: Not on file  . Number of children: 0  . Years of education: BS  . Highest education level: Not on file  Occupational History  . Occupation: Buyer, retail: [employer]  . Financial resource strain: Not on file  . Food insecurity:    Worry: Not on file    Inability: Not on file  . Transportation needs:    Medical: Not on file    Non-medical: Not on file  Tobacco Use  . Smoking status: Never Smoker  . Smokeless tobacco: Never Used  Substance and Sexual Activity  . Alcohol use: No    Alcohol/week: 0.0 standard drinks  . Drug use: No  . Sexual activity: Yes  Lifestyle  . Physical activity:    Days per week: Not on file    Minutes per session: Not on file  . Stress: Not on file  Relationships  . Social connections:    Talks on phone: Not on file  Gets together: Not on file    Attends religious service: Not on file    Active member of club or organization: Not on file    Attends meetings of clubs or organizations: Not on file    Relationship status: Not on file  . Intimate partner violence:    Fear of current or ex partner: Not on file    Emotionally abused: Not on file    Physically abused: Not on file    Forced sexual activity: Not on file  Other Topics Concern  . 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    Patient has BS degree.    Patient does not have any children.    Patient drinks 8 ounces of caffeine daily.    Patient is left handed.      PHYSICAL EXAM  Vitals:   11/03/17 1302  BP: 128/82  Pulse: 84  Weight: 187 lb (84.8 kg)  Height: 5' 1.25" (1.556 m)   Body mass index is 35.05 kg/m. Generalized: Well developed, Obese female in no acute distress  Head: normocephalic and atraumatic,. Oropharynx benign  Neck: Supple,  Musculoskeletal: No deformity   Neurological examination   Mentation: Alert oriented to time, place, history taking. Attention span and concentration appropriate. Recent and remote memory intact. Follows all commands speech and language fluent.   Cranial nerve II-XII: .Pupils were equal round reactive to light extraocular movements were full, visual field were full on confrontational test. Facial sensation and strength were normal. hearing was intact to finger rubbing bilaterally. Uvula tongue midline. head turning and shoulder shrug were normal and symmetric.Tongue protrusion into cheek strength was normal. Motor: normal bulk and tone, full strength in the BUE, BLE, fine finger movements normal, no pronator drift. No focal weakness Sensory: normal and symmetric to light touch, pinprick, and Vibration in the upper and lower extremities  Coordination: finger-nose-finger, heel-to-shin bilaterally, no dysmetria Reflexes: Symmetric upper and lower, plantar responses were flexor bilaterally. Gait and Station: Rising up from seated position without assistance, normal stance, moderate stride, good arm swing, smooth turning, able to perform tiptoe, and heel walking without difficulty. Tandem gait is steady  DIAGNOSTIC DATA (LABS, IMAGING, TESTING) -  ASSESSMENT AND PLAN 67 y.o. year old female has a past medical history of High blood pressure and Seizure (oct  1999). here to follow-up. Seizure disorder is stable. Concussion at work 2015.  No memory loss associated with this.  PLAN: Continue phenobarbital at current dose, will refill Call for any seizure activity Will check labs todayCBC, CMP to monitor adverse effects of phenobarbital  PB level to make sure it is therapeutic Encouraged exercise to lose weight for over all health and well being Follow up yearly [name], [facility], [facility], [facility] [address] [place], [place] [address] [phone]

## 2017-11-03 ENCOUNTER — Encounter

## 2017-11-03 ENCOUNTER — Ambulatory Visit: Payer: BLUE CROSS/BLUE SHIELD | Admitting: Nurse Practitioner

## 2017-11-03 ENCOUNTER — Encounter: Payer: Self-pay | Admitting: Nurse Practitioner

## 2017-11-03 VITALS — BP 128/82 | HR 84 | Ht 61.25 in | Wt 187.0 lb

## 2017-11-03 DIAGNOSIS — G40309 Generalized idiopathic epilepsy and epileptic syndromes, not intractable, without status epilepticus: Secondary | ICD-10-CM | POA: Diagnosis not present

## 2017-11-03 DIAGNOSIS — Z5181 Encounter for therapeutic drug level monitoring: Secondary | ICD-10-CM

## 2017-11-03 MED ORDER — PHENOBARBITAL 100 MG PO TABS: 100.0000 mg | ORAL_TABLET | Freq: Every day | ORAL | 1 refills | Status: DC

## 2017-11-03 NOTE — Patient Instructions (Signed)
Continue phenobarbital at current dose, will refill Call for any seizure activity Will check labs todayCBC, CMP to monitor adverse effects of phenobarbital  PB level to make sure it is therapeutic Encouraged exercise to lose weight Follow up yearly

## 2017-11-03 NOTE — Progress Notes (Signed)
I have read the note, and I agree with the clinical assessment and plan.  Charles K Willis   

## 2017-11-04 LAB — CBC WITH DIFFERENTIAL/PLATELET
BASOS: 0 %
Basophils Absolute: 0 10*3/uL (ref 0.0–0.2)
EOS (ABSOLUTE): 0 10*3/uL (ref 0.0–0.4)
Eos: 0 %
Hematocrit: 35.4 % (ref 34.0–46.6)
Hemoglobin: 12.1 g/dL (ref 11.1–15.9)
IMMATURE GRANS (ABS): 0 10*3/uL (ref 0.0–0.1)
Immature Granulocytes: 0 %
LYMPHS: 46 %
Lymphocytes Absolute: 3.3 10*3/uL — ABNORMAL HIGH (ref 0.7–3.1)
MCH: 30.5 pg (ref 26.6–33.0)
MCHC: 34.2 g/dL (ref 31.5–35.7)
MCV: 89 fL (ref 79–97)
MONOCYTES: 4 %
Monocytes Absolute: 0.3 10*3/uL (ref 0.1–0.9)
NEUTROS ABS: 3.5 10*3/uL (ref 1.4–7.0)
Neutrophils: 50 %
Platelets: 268 10*3/uL (ref 150–450)
RBC: 3.97 x10E6/uL (ref 3.77–5.28)
RDW: 13 % (ref 12.3–15.4)
WBC: 7 10*3/uL (ref 3.4–10.8)

## 2017-11-04 LAB — COMPREHENSIVE METABOLIC PANEL
ALBUMIN: 4.2 g/dL (ref 3.6–4.8)
ALT: 20 IU/L (ref 0–32)
AST: 25 IU/L (ref 0–40)
Albumin/Globulin Ratio: 1 — ABNORMAL LOW (ref 1.2–2.2)
Alkaline Phosphatase: 139 IU/L — ABNORMAL HIGH (ref 39–117)
BILIRUBIN TOTAL: 0.3 mg/dL (ref 0.0–1.2)
BUN/Creatinine Ratio: 17 (ref 12–28)
BUN: 18 mg/dL (ref 8–27)
CO2: 24 mmol/L (ref 20–29)
CREATININE: 1.09 mg/dL — AB (ref 0.57–1.00)
Calcium: 9.3 mg/dL (ref 8.7–10.3)
Chloride: 102 mmol/L (ref 96–106)
GFR, EST AFRICAN AMERICAN: 62 mL/min/{1.73_m2} (ref >59)
GFR, EST NON AFRICAN AMERICAN: 53 mL/min/{1.73_m2} — AB (ref >59)
GLUCOSE: 75 mg/dL (ref 65–99)
Globulin, Total: 4.2 g/dL (ref 1.5–4.5)
Potassium: 4.5 mmol/L (ref 3.5–5.2)
Sodium: 139 mmol/L (ref 134–144)
Total Protein: 8.4 g/dL (ref 6.0–8.5)

## 2017-11-04 LAB — PHENOBARBITAL LEVEL: PHENOBARBITAL, SERUM: 28 ug/mL (ref 15–40)

## 2017-11-08 ENCOUNTER — Telehealth: Payer: Self-pay | Admitting: *Deleted

## 2017-11-08 NOTE — Telephone Encounter (Signed)
LVM informing patient that her labs are okay except Creatinine is mildly elevated. Advised she stay well hydrated, and a copy will be faxed to her PCP. Left number for any questions. Labs faxed to Dr Cathlean Marseilles.

## 2018-02-07 ENCOUNTER — Other Ambulatory Visit: Payer: Self-pay

## 2018-02-07 ENCOUNTER — Encounter: Payer: Self-pay | Admitting: Family Medicine

## 2018-02-07 ENCOUNTER — Ambulatory Visit (INDEPENDENT_AMBULATORY_CARE_PROVIDER_SITE_OTHER): Payer: BLUE CROSS/BLUE SHIELD | Admitting: Family Medicine

## 2018-02-07 VITALS — BP 124/82 | HR 62 | Temp 98.0°F | Ht 61.25 in | Wt 181.4 lb

## 2018-02-07 DIAGNOSIS — E2839 Other primary ovarian failure: Secondary | ICD-10-CM

## 2018-02-07 DIAGNOSIS — Z23 Encounter for immunization: Secondary | ICD-10-CM

## 2018-02-07 DIAGNOSIS — Z6830 Body mass index (BMI) 30.0-30.9, adult: Secondary | ICD-10-CM | POA: Diagnosis not present

## 2018-02-07 DIAGNOSIS — Z0000 Encounter for general adult medical examination without abnormal findings: Secondary | ICD-10-CM

## 2018-02-07 DIAGNOSIS — Z1239 Encounter for other screening for malignant neoplasm of breast: Secondary | ICD-10-CM | POA: Diagnosis not present

## 2018-02-07 DIAGNOSIS — I10 Essential (primary) hypertension: Secondary | ICD-10-CM | POA: Diagnosis not present

## 2018-02-07 DIAGNOSIS — M1731 Unilateral post-traumatic osteoarthritis, right knee: Secondary | ICD-10-CM

## 2018-02-07 DIAGNOSIS — E786 Lipoprotein deficiency: Secondary | ICD-10-CM

## 2018-02-07 DIAGNOSIS — D126 Benign neoplasm of colon, unspecified: Secondary | ICD-10-CM | POA: Diagnosis not present

## 2018-02-07 LAB — CBC WITH DIFFERENTIAL/PLATELET
BASOS PCT: 0.2 % (ref 0.0–3.0)
Basophils Absolute: 0 10*3/uL (ref 0.0–0.1)
EOS ABS: 0 10*3/uL (ref 0.0–0.7)
EOS PCT: 0 % (ref 0.0–5.0)
HCT: 37.6 % (ref 36.0–46.0)
HEMOGLOBIN: 12.6 g/dL (ref 12.0–15.0)
LYMPHS ABS: 2.9 10*3/uL (ref 0.7–4.0)
Lymphocytes Relative: 43.4 % (ref 12.0–46.0)
MCHC: 33.6 g/dL (ref 30.0–36.0)
MCV: 92.6 fl (ref 78.0–100.0)
MONO ABS: 0.3 10*3/uL (ref 0.1–1.0)
Monocytes Relative: 5.2 % (ref 3.0–12.0)
NEUTROS PCT: 51.2 % (ref 43.0–77.0)
Neutro Abs: 3.4 10*3/uL (ref 1.4–7.7)
Platelets: 269 10*3/uL (ref 150.0–400.0)
RBC: 4.06 Mil/uL (ref 3.87–5.11)
RDW: 13.5 % (ref 11.5–15.5)
WBC: 6.6 10*3/uL (ref 4.0–10.5)

## 2018-02-07 LAB — LIPID PANEL
CHOL/HDL RATIO: 5
Cholesterol: 158 mg/dL (ref 0–200)
HDL: 32.8 mg/dL — ABNORMAL LOW (ref >39.00)
LDL Cholesterol: 90 mg/dL (ref 0–99)
NONHDL: 125.18
Triglycerides: 178 mg/dL — ABNORMAL HIGH (ref 0.0–149.0)
VLDL: 35.6 mg/dL (ref 0.0–40.0)

## 2018-02-07 LAB — POCT URINALYSIS DIPSTICK
BILIRUBIN UA: NEGATIVE
Blood, UA: NEGATIVE
GLUCOSE UA: NEGATIVE
KETONES UA: NEGATIVE
Leukocytes, UA: NEGATIVE
Nitrite, UA: NEGATIVE
PH UA: 6 (ref 5.0–8.0)
Protein, UA: NEGATIVE
SPEC GRAV UA: 1.015 (ref 1.010–1.025)
UROBILINOGEN UA: 0.2 U/dL

## 2018-02-07 LAB — COMPREHENSIVE METABOLIC PANEL
ALBUMIN: 4 g/dL (ref 3.5–5.2)
ALT: 15 U/L (ref 0–35)
AST: 23 U/L (ref 0–37)
Alkaline Phosphatase: 124 U/L — ABNORMAL HIGH (ref 39–117)
BUN: 22 mg/dL (ref 6–23)
CO2: 28 mEq/L (ref 19–32)
CREATININE: 1.15 mg/dL (ref 0.40–1.20)
Calcium: 9.8 mg/dL (ref 8.4–10.5)
Chloride: 103 mEq/L (ref 96–112)
GFR: 60.77 mL/min (ref >60.00)
GLUCOSE: 80 mg/dL (ref 70–99)
Potassium: 4.8 mEq/L (ref 3.5–5.1)
SODIUM: 138 meq/L (ref 135–145)
Total Bilirubin: 0.3 mg/dL (ref 0.2–1.2)
Total Protein: 8.7 g/dL — ABNORMAL HIGH (ref 6.0–8.3)

## 2018-02-07 NOTE — Patient Instructions (Addendum)
Please return in 6 months for follow up of your hypertension. We will give you your second Shingrix vaccination at that time as well.   Today you were given your Shingris vaccination.   We will call you with information regarding your referral appointment. [facility].  If you do not hear from [place] within the next 2 weeks, please let me know. It can take 1-2 weeks to get appointments set up with the specialists.   We will call you about the Synvisc-One injection for your right knee.   If you have any questions or concerns, please don't hesitate to send me a message via MyChart or call the office at [phone]. Thank you for visiting with [place] today! It's our pleasure caring for you.  Recommendations for women to keep healthy:   EXERCISE AND DIET: We recommended that you start or continue a regular exercise program for good health. Regular exercise means any activity that makes your heart beat faster and makes you sweat. We recommend exercising at least 30 minutes per day at least 3 days a week, preferably 4 or 5. We also recommend a diet low in fat and sugar. Inactivity, poor dietary choices and obesity can cause diabetes, heart attack, stroke, and kidney damage, among others.   ALCOHOL AND SMOKING: Women should limit their alcohol intake to no more than 7 drinks/beers/glasses of wine (combined, not each!) per week. Moderation of alcohol intake to this level decreases your risk of breast cancer and liver damage. And of course, no recreational drugs are part of a healthy lifestyle. And absolutely no smoking or even second hand smoke. Most people know smoking can cause heart and lung diseases, but did you know it also contributes to weakening of your bones? Aging of your skin? Yellowing of your teeth and nails?  CALCIUM AND VITAMIN D: Adequate intake of calcium and Vitamin D are recommended. The recommendations for exact amounts of these  supplements seem to change often, but generally speaking 600 mg of calcium (either carbonate or citrate) and 800 units of Vitamin D per day seems prudent. Certain women may benefit from higher intake of Vitamin D. If you are among these women, your doctor will have told you during your visit.   PAP SMEARS: Pap smears, to check for cervical cancer or precancers, have traditionally been done yearly, although recent scientific advances have shown that most women can have pap smears less often. However, every woman still should have a physical exam from her gynecologist every year. It will include a breast check, inspection of the vulva and vagina to check for abnormal growths or skin changes, a visual exam of the cervix, and then an exam to evaluate the size and shape of the uterus and ovaries. And after 65 years of age, a rectal exam is indicated to check for rectal cancers. We will also provide age appropriate advice regarding health maintenance, like when you should have certain vaccines, screening for sexually transmitted diseases, bone density testing, colonoscopy, mammograms, etc.   MAMMOGRAMS: All women over 76 years old should have a yearly mammogram. Many facilities now offer a "3D" mammogram, which may cost around $50 extra out of pocket. If possible, we recommend you accept the option to have the 3D mammogram performed. It both reduces the number of women who will be called back for extra views which then turn out to be normal, and it is better than the routine mammogram at detecting  truly abnormal areas.   COLONOSCOPY: Colonoscopy to screen for colon cancer is recommended for all women at age 18. We know, you hate the idea of the prep. We agree, BUT, having colon cancer and not knowing it is worse!! Colon cancer so often starts as a polyp that can be seen and removed at colonscopy, which can quite literally save your life! And if your first colonoscopy is normal and you have no family history of colon  cancer, most women don't have to have it again for 10 years. Once every ten years, you can do something that may end up saving your life, right? We will be happy to help you get it scheduled when you are ready. Be sure to check your insurance coverage so you understand how much it will cost. It may be covered as a preventative service at no cost, but you should check your particular policy.     Fat and Cholesterol Restricted Diet Getting too much fat and cholesterol in your diet may cause health problems. Following this diet helps keep your fat and cholesterol at normal levels. This can keep you from getting sick. What types of fat should I choose?  Choose monosaturated and polyunsaturated fats. These are found in foods such as olive oil, canola oil, flaxseeds, walnuts, almonds, and seeds.  Eat more omega-3 fats. Good choices include salmon, mackerel, sardines, tuna, flaxseed oil, and ground flaxseeds.  Limit saturated fats. These are in animal products such as meats, butter, and cream. They can also be in plant products such as palm oil, palm kernel oil, and coconut oil.  Avoid foods with partially hydrogenated oils in them. These contain trans fats. Examples of foods that have trans fats are stick margarine, some tub margarines, cookies, crackers, and other baked goods. What general guidelines do I need to follow?  Check food labels. Look for the words "trans fat" and "saturated fat."  When preparing a meal: ? Fill half of your plate with vegetables and green salads. ? Fill one fourth of your plate with whole grains. Look for the word "whole" as the first word in the ingredient list. ? Fill one fourth of your plate with lean protein foods.  Eat more foods that have fiber, like apples, carrots, beans, peas, and barley.  Eat more home-cooked foods. Eat less at restaurants and buffets.  Limit or avoid alcohol.  Limit foods high in starch and sugar.  Limit fried foods.  Cook foods  without frying them. Baking, boiling, grilling, and broiling are all great options.  Lose weight if you are overweight. Losing even a small amount of weight can help your overall health. It can also help prevent diseases such as diabetes and heart disease. What foods can I eat? Grains Whole grains, such as whole wheat or whole grain breads, crackers, cereals, and pasta. Unsweetened oatmeal, bulgur, barley, quinoa, or brown rice. Corn or whole wheat flour tortillas. Vegetables Fresh or frozen vegetables (raw, steamed, roasted, or grilled). Green salads. Fruits All fresh, canned (in natural juice), or frozen fruits. Meat and Other Protein Products Ground beef (85% or leaner), grass-fed beef, or beef trimmed of fat. Skinless chicken or [place]. Ground chicken or [place]. Pork trimmed of fat. All fish and seafood. Eggs. Dried beans, peas, or lentils. Unsalted nuts or seeds. Unsalted canned or dry beans. Dairy Low-fat dairy products, such as skim or 1% milk, 2% or reduced-fat cheeses, low-fat ricotta or cottage cheese, or plain low-fat yogurt. Fats and Oils Tub margarines without trans  fats. Light or reduced-fat mayonnaise and salad dressings. Avocado. Olive, canola, sesame, or safflower oils. Natural peanut or almond butter (choose ones without added sugar and oil). The items listed above may not be a complete list of recommended foods or beverages. Contact your dietitian for more options. What foods are not recommended? Grains White bread. White pasta. White rice. Cornbread. Bagels, pastries, and croissants. Crackers that contain trans fat. Vegetables White potatoes. Corn. Creamed or fried vegetables. Vegetables in a cheese sauce. Fruits Dried fruits. Canned fruit in light or heavy syrup. Fruit juice. Meat and Other Protein Products Fatty cuts of meat. Ribs, chicken wings, bacon, sausage, bologna, salami, chitterlings, fatback, hot dogs, bratwurst, and packaged luncheon meats. Liver and organ  meats. Dairy Whole or 2% milk, cream, half-and-half, and cream cheese. Whole milk cheeses. Whole-fat or sweetened yogurt. Full-fat cheeses. Nondairy creamers and whipped toppings. Processed cheese, cheese spreads, or cheese curds. Sweets and Desserts Corn syrup, sugars, honey, and molasses. Candy. Jam and jelly. Syrup. Sweetened cereals. Cookies, pies, cakes, donuts, muffins, and ice cream. Fats and Oils Butter, stick margarine, lard, shortening, ghee, or bacon fat. Coconut, palm kernel, or palm oils. Beverages Alcohol. Sweetened drinks (such as sodas, lemonade, and fruit drinks or punches). The items listed above may not be a complete list of foods and beverages to avoid. Contact your dietitian for more information. This information is not intended to replace advice given to you by your health care provider. Make sure you discuss any questions you have with your health care provider. Document Released: 08/11/2011 Document Revised: 10/17/2015 Document Reviewed: 05/11/2013 Elsevier Interactive Patient Education  [employer].

## 2018-02-07 NOTE — Progress Notes (Signed)
Subjective  Chief Complaint  Patient presents with  . Annual Exam    doing well, patient is fasting, flu shot today, due for colonoscopy   . Knee Pain    stiffness in both knees x 1 year     HPI: Whitney Hines is a 67 y.o. female who presents to [place] [facility] today for a Female Wellness Visit. She also has the concerns and/or needs as listed above in the chief complaint. These will be addressed in addition to the Health Maintenance Visit.   Wellness Visit: annual visit with health maintenance review and exam without Pap   HM: due mammo and dexa in January at the [facility]. Diet and exercise remain "the same" but she has lost about 6-7 pounds since September.   Imms: due shingrix and flu  H/o tubular adenoma due for colon cancer surveillance. Dr. Hassell Done retired. Needs new GI. No sxs.   Chronic disease f/u and/or acute problem visit: (deemed necessary to be done in addition to the wellness visit):  C/o right knee pain; intermittent and worse with climbing stairs. No locking or giveway. No injury known OA. No swelling or redness.   HTN: Feeling well. Taking medications w/o adverse effects. No symptoms of CHF, angina; no palpitations, sob, cp or lower extremity edema. Compliant with meds.   Low HDL with elevated cardiovascular risk score. Not on a statin.  The 10-year ASCVD risk score Mikey Bussing DC Brooke Bonito., et al., 2013) is: 8.9%   Values used to calculate the score:     Age: 68 years     Sex: Female     Is Non-Hispanic African American: Yes     Diabetic: No     Tobacco smoker: No     Systolic Blood Pressure: 621 mmHg     Is BP treated: Yes     HDL Cholesterol: 28.1 mg/dL     Total Cholesterol: 177 mg/dL  BP Readings from Last 3 Encounters:  02/07/18 124/82  11/03/17 128/82  08/16/17 120/80   . Wt Readings from Last 3 Encounters:  02/07/18 181 lb 6.4 oz (82.3 kg)  11/03/17 187 lb (84.8 kg)  08/16/17 183 lb 12.8 oz (83.4 kg)    Assessment  1.  Annual physical exam   2. Tubular adenoma of colon   3. Essential hypertension   4. BMI 30.0-30.9,adult   5. Unilateral post-traumatic osteoarthritis, right knee   6. Breast cancer screening   7. Hypoestrogenism   8. Low HDL (under 40)   9. Encounter for immunization      Plan  Female Wellness Visit:  Age appropriate Health Maintenance and Prevention measures were discussed with patient. Included topics are cancer screening recommendations, ways to keep healthy (see AVS) including dietary and exercise recommendations, regular eye and dental care, use of seat belts, and avoidance of moderate alcohol use and tobacco use. Ordered mammo and dexa for January, BC. Referred to [facility] for due colonoscopy  BMI: discussed patient's BMI and encouraged positive lifestyle modifications to help get to or maintain a target BMI.  HM needs and immunizations were addressed and ordered. See below for orders. See HM and immunization section for updates. shingrix #1 today with flu.   Routine labs and screening tests ordered including cmp, cbc and lipids where appropriate.  Discussed recommendations regarding Vit D and calcium supplementation (see AVS)  Chronic disease management visit and/or acute problem visit:  HTN: This medical condition is well controlled. There are no signs of  complications, medication side effects, or red flags. Patient is instructed to continue the current treatment plan without change in therapies or medications.   Low HDL and elevated ascvd risk score: recheck fasting lipids today and institute statin if indicated.   Knee OA; supportive care with otc meds and vmo exercises. rec visco-supplementation. Pt to schedule.  Follow up: Return in about 6 months (around 08/09/2018) for follow up Hypertension.  Orders Placed This Encounter  Procedures  . MM DIGITAL SCREENING BILATERAL  . DG Bone Density  . Flu vaccine HIGH DOSE PF  . Varicella-zoster vaccine IM (Shingrix)  . CBC  with Differential/Platelet  . Comprehensive metabolic panel  . Lipid panel  . Ambulatory referral to Gastroenterology  . POCT urinalysis dipstick   No orders of the defined types were placed in this encounter.     Lifestyle: Body mass index is 34 kg/m. Wt Readings from Last 3 Encounters:  02/07/18 181 lb 6.4 oz (82.3 kg)  11/03/17 187 lb (84.8 kg)  08/16/17 183 lb 12.8 oz (83.4 kg)   Diet: general Exercise: rarely,   Patient Active Problem List   Diagnosis Date Noted  . Tubular adenoma of colon 01/10/2015    Priority: High    Overview:  Colonoscopy 12/2014, q3-5 year f/u   . BMI 30.0-30.9,adult 12/31/2014    Priority: High  . Essential hypertension 12/31/2014    Priority: High  . Generalized idiopathic epilepsy and epileptic syndromes, without status epilepticus, not intractable ([place]) 05/15/2013    Priority: High  . Low HDL (under 40) 02/25/2017  . Unilateral post-traumatic osteoarthritis, right knee 02/25/2017   Health Maintenance  Topic Date Due  . DEXA SCAN  03/26/2018 (Originally 07-01-1950)  . COLONOSCOPY  05/24/2018 (Originally 01/06/2018)  . MAMMOGRAM  03/22/2018  . PNA vac Low Risk Adult (2 of 2 - PPSV23) 08/17/2018  . PAP SMEAR-Modifier  05/20/2020  . TETANUS/TDAP  12/30/2024  . INFLUENZA VACCINE  Completed  . Hepatitis C Screening  Completed  . HIV Screening  Completed   Immunization History  Administered Date(s) Administered  . Influenza, High Dose Seasonal PF 02/07/2018  . Influenza, Seasonal, Injecte, Preservative Fre 12/31/2014  . Influenza,inj,Quad PF,6+ Mos 11/21/2015, 02/25/2017  . Pneumococcal Conjugate-13 08/16/2017  . Tdap 12/31/2014  . Zoster 02/22/2015  . Zoster Recombinat (Shingrix) 02/07/2018   We updated and reviewed the patient's past history in detail and it is documented below. Allergies: Patient is allergic to dilantin  [phenytoin sodium extended] and dilantin [phenytoin]. Past Medical History Patient  has a past medical  history of High blood pressure, Neuropathy, and Seizure ([place]) (oct 1999). Past Surgical History Patient  has a past surgical history that includes Multiple tooth extractions and Colonoscopy with propofol (N/A, 01/07/2015). Family History: Patient family history includes Heart attack in her mother; Heart disease in her mother; Hypertension in her father; Stroke in her paternal aunt, paternal aunt, and paternal uncle. Social History:  Patient  reports that she has never smoked. She has never used smokeless tobacco. She reports that she does not drink alcohol or use drugs.  Review of Systems: Constitutional: negative for fever or malaise Ophthalmic: negative for photophobia, double vision or loss of vision Cardiovascular: negative for chest pain, dyspnea on exertion, or new LE swelling Respiratory: negative for SOB or persistent cough Gastrointestinal: negative for abdominal pain, change in bowel habits or melena Genitourinary: negative for dysuria or gross hematuria, no abnormal uterine bleeding or disharge Musculoskeletal: negative for new gait disturbance or muscular weakness Integumentary: negative  for new or persistent rashes, no breast lumps Neurological: negative for TIA or stroke symptoms Psychiatric: negative for SI or delusions Allergic/Immunologic: negative for hives  Patient Care Team    Relationship Specialty Notifications Start End  Leamon Arnt, MD PCP - General Family Medicine  01/07/15     Objective  Vitals: BP 124/82   Pulse 62   Temp 98 F (36.7 C)   Ht 5' 1.25" (1.556 m)   Wt 181 lb 6.4 oz (82.3 kg)   LMP  (LMP Unknown) Comment: LMP 2004  SpO2 98%   BMI 34.00 kg/m  General:  Well developed, well nourished, no acute distress  Psych:  Alert and orientedx3,normal mood and affect HEENT:  Normocephalic, atraumatic, non-icteric sclera, PERRL, oropharynx is clear without mass or exudate, supple neck without adenopathy, mass or thyromegaly Cardiovascular:  Normal  S1, S2, RRR without gallop, rub or murmur, nondisplaced PMI Respiratory:  Good breath sounds bilaterally, CTAB with normal respiratory effort Gastrointestinal: normal bowel sounds, soft, non-tender, no noted masses. No HSM MSK: no deformities, contusions. Joints are without erythema or swelling. Spine and CVA region are nontender, right knee with crepitus. No swelling or heat. FROM Skin:  Warm, no rashes or suspicious lesions noted Neurologic:    Mental status is normal. CN 2-11 are normal. Gross motor and sensory exams are normal. Normal gait. No tremor Breast Exam: No mass, skin retraction or nipple discharge is appreciated in either breast. No axillary adenopathy. Fibrocystic changes are not noted   Commons side effects, risks, benefits, and alternatives for medications and treatment plan prescribed today were discussed, and the patient expressed understanding of the given instructions. Patient is instructed to call or message via MyChart if he/she has any questions or concerns regarding our treatment plan. No barriers to understanding were identified. We discussed Red Flag symptoms and signs in detail. Patient expressed understanding regarding what to do in case of urgent or emergency type symptoms.   Medication list was reconciled, printed and provided to the patient in AVS. Patient instructions and summary information was reviewed with the patient as documented in the AVS. This note was prepared with assistance of Dragon voice recognition software. Occasional wrong-word or sound-a-like substitutions may have occurred due to the inherent limitations of voice recognition software

## 2018-02-10 ENCOUNTER — Telehealth: Payer: Self-pay | Admitting: Emergency Medicine

## 2018-02-10 NOTE — Telephone Encounter (Signed)
Called patient to schedule Synvisc Knee Injection, she states she will call back to schedule.   I have ordered through [facility], should be here on Friday.   Doloris Hall,  LPN

## 2018-02-11 NOTE — Telephone Encounter (Signed)
LM to  RC, CRM Created  

## 2018-02-15 ENCOUNTER — Other Ambulatory Visit: Payer: Self-pay | Admitting: Family Medicine

## 2018-02-15 NOTE — Telephone Encounter (Signed)
Patient has been scheduled

## 2018-02-24 ENCOUNTER — Ambulatory Visit: Payer: BLUE CROSS/BLUE SHIELD | Admitting: Family Medicine

## 2018-02-24 ENCOUNTER — Other Ambulatory Visit: Payer: Self-pay

## 2018-02-24 ENCOUNTER — Encounter: Payer: Self-pay | Admitting: Family Medicine

## 2018-02-24 VITALS — BP 126/84 | HR 67 | Temp 98.4°F | Resp 16 | Ht 61.25 in | Wt 181.6 lb

## 2018-02-24 DIAGNOSIS — M1731 Unilateral post-traumatic osteoarthritis, right knee: Secondary | ICD-10-CM | POA: Diagnosis not present

## 2018-02-24 NOTE — Progress Notes (Signed)
Right knee Synvisc One injection.  Lot #: [id number] Exp: 08/2020 Product #: [id number]

## 2018-02-24 NOTE — Patient Instructions (Addendum)
Please follow up if symptoms do not improve or as needed.  You are scheduled for a recheck in June 2020.   Hylan G-F 20 intra-articular injection What is this medicine? HYLAN G-F 20 (HI lan G F 20) is used to treat osteoarthritis of the knee. It lubricates and cushions the joint, reducing pain in the knee. This medicine may be used for other purposes; ask your health care provider or pharmacist if you have questions. COMMON BRAND NAME(S): Synvisc, Synvisc-One What should I tell my health care provider before I take this medicine? They need to know if you have any of these conditions: -severe knee inflammation -skin conditions or sensitivity -skin or joint infection -venous stasis -an unusual or allergic reaction to hylan G-F 20, hyaluronan (sodium hyaluronate), eggs, other medicines, foods, dyes, or preservatives -pregnant or trying to get pregnant -breast-feeding How should I use this medicine? This medicine is for injection into the knee joint. It is given by a health care professional in a hospital or clinic setting. Talk to your pediatrician regarding the use of this medicine in children. This medicine is not approved for use in children. Overdosage: If you think you have taken too much of this medicine contact a poison control center or emergency room at once. NOTE: This medicine is only for you. Do not share this medicine with others. What if I miss a dose? Keep appointments for follow-up doses as directed. For Synvisc, you will need weekly injections for 3 doses. It is important not to miss your dose. If you will receive Synvisc-One, then only 1 injection will be needed. Call your doctor or health care professional if you are unable to keep an appointment. What may interact with this medicine? Do not take this medicine with any of the following medications: -other injections for the joint like steroids or anesthetics -certain skin disinfectants like benzalkonium chloride This list  may not describe all possible interactions. Give your health care provider a list of all the medicines, herbs, non-prescription drugs, or dietary supplements you use. Also tell them if you smoke, drink alcohol, or use illegal drugs. Some items may interact with your medicine. What should I watch for while using this medicine? Tell your doctor or healthcare professional if your symptoms do not start to get better or if they get worse. Your condition will be monitored carefully while you are receiving this medicine. Most persons get pain relief for up to 6 months after treatment. Avoid strenuous activities (high-impact sports, jogging) or major weight-bearing activities for 48 hours after the injection. What side effects may I notice from receiving this medicine? Side effects that you should report to your doctor or health care professional as soon as possible: -allergic reactions like skin rash, itching or hives, swelling of the face, lips, or tongue -difficulty breathing -fever or chills -severe joint pain or swelling -unusual bleeding or bruising Side effects that usually do not require medical attention (report to your doctor or health care professional if they continue or are bothersome): -dizziness -flushing -general ill feeling or flu-like symptoms -headache -minor joint pain or swelling -muscle pain or cramps -pain, redness, irritation or bruising at site of injection This list may not describe all possible side effects. Call your doctor for medical advice about side effects. You may report side effects to FDA at 1-800-FDA-1088. Where should I keep my medicine? This drug is given in a hospital or clinic and will not be stored at home. NOTE: This sheet is a summary. It  may not cover all possible information. If you have questions about this medicine, talk to your doctor, pharmacist, or health care provider.  2019 Elsevier/Gold Standard (2015-03-14 11:48:41)

## 2018-02-24 NOTE — Progress Notes (Signed)
Chief Complaint  Patient presents with  . Knee Pain    right knee Synvisc injection    Today's Vitals   02/24/18 1137  BP: 126/84  Pulse: 67  Resp: 16  Temp: 98.4 F (36.9 C)  TempSrc: Oral  SpO2: 97%  Weight: 181 lb 9.6 oz (82.4 kg)  Height: 5' 1.25" (1.556 m)   Body mass index is 34.03 kg/m.  Encounter Diagnosis  Name Primary?  . Unilateral post-traumatic osteoarthritis, right knee Yes     Procedure Note for Injection of Synvisc 16mg /injection (8mg /ml)(59ml) Dx: osteoarthritis of knee(s) Indication: pain relief .Right Knee(s) injection(s) was/were performed as follows:  Initial universal "time out" was performed  Verbal consent was obtained regarding procedure, indications, risks and benefits   With the patient sitting on edge of bed, the knee(s) was/were cleansed with betadine and alcohol, anesthesia with ethyl chloride spray was done and using the lateral approach, Synvisc-one 48mg  was injected to the knee(s). The flow was easy into the large joint space and the patient tolerated the procedure well. There were no complications. No bleeding. The patient left the exam room in good condition.

## 2018-04-13 ENCOUNTER — Encounter: Payer: Self-pay | Admitting: Family Medicine

## 2018-04-25 ENCOUNTER — Ambulatory Visit
Admission: RE | Admit: 2018-04-25 | Discharge: 2018-04-25 | Disposition: A | Discharge status: Home / Self Care | Payer: BLUE CROSS/BLUE SHIELD | Source: Ambulatory Visit | Attending: Family Medicine | Admitting: Family Medicine

## 2018-04-25 DIAGNOSIS — Z1239 Encounter for other screening for malignant neoplasm of breast: Secondary | ICD-10-CM

## 2018-04-25 DIAGNOSIS — E2839 Other primary ovarian failure: Secondary | ICD-10-CM

## 2018-04-26 ENCOUNTER — Encounter: Payer: Self-pay | Admitting: Family Medicine

## 2018-04-26 DIAGNOSIS — M8580 Other specified disorders of bone density and structure, unspecified site: Secondary | ICD-10-CM

## 2018-04-26 HISTORY — DX: Other specified disorders of bone density and structure, unspecified site: M85.80

## 2018-07-05 ENCOUNTER — Telehealth: Payer: Self-pay | Admitting: Family Medicine

## 2018-07-05 DIAGNOSIS — L659 Nonscarring hair loss, unspecified: Secondary | ICD-10-CM

## 2018-07-05 NOTE — Telephone Encounter (Signed)
Please advise    Copied from [place] [phone]. Topic: Referral - Request for Referral >> Jul 05, 2018  1:17 PM Rainey Pines A wrote: Has patient seen PCP for this complaint? Yes *If NO, is insurance requiring patient see PCP for this issue before PCP can refer them? Referral for which specialty: Dermatology Preferred provider/office: Dr. Lois Huxley [phone] Reason for referral: Hair loss

## 2018-07-06 NOTE — Addendum Note (Signed)
Addended by: Layla Barter on: 07/06/2018 08:47 AM   Modules accepted: Orders

## 2018-07-06 NOTE — Telephone Encounter (Signed)
Ok for referral?

## 2018-07-06 NOTE — Addendum Note (Signed)
Addended by: Layla Barter on: 07/06/2018 08:45 AM   Modules accepted: Orders

## 2018-07-06 NOTE — Telephone Encounter (Signed)
Pt aware referral made

## 2018-07-06 NOTE — Telephone Encounter (Signed)
Please advise 

## 2018-07-25 ENCOUNTER — Other Ambulatory Visit: Payer: Self-pay | Admitting: *Deleted

## 2018-07-25 MED ORDER — PHENOBARBITAL 100 MG PO TABS: 100.0000 mg | ORAL_TABLET | Freq: Every day | ORAL | 1 refills | Status: DC

## 2018-07-25 NOTE — Telephone Encounter (Signed)
Drug registry checked:  Last fill 04-14-18 #90 (taking one tablet po qhs).

## 2018-08-08 ENCOUNTER — Ambulatory Visit (INDEPENDENT_AMBULATORY_CARE_PROVIDER_SITE_OTHER): Payer: BC Managed Care – PPO

## 2018-08-08 ENCOUNTER — Ambulatory Visit: Payer: BC Managed Care – PPO

## 2018-08-08 ENCOUNTER — Encounter: Payer: Self-pay | Admitting: Family Medicine

## 2018-08-08 ENCOUNTER — Other Ambulatory Visit: Payer: Self-pay

## 2018-08-08 ENCOUNTER — Ambulatory Visit (INDEPENDENT_AMBULATORY_CARE_PROVIDER_SITE_OTHER): Payer: BC Managed Care – PPO | Admitting: Family Medicine

## 2018-08-08 ENCOUNTER — Ambulatory Visit: Payer: Self-pay | Admitting: Family Medicine

## 2018-08-08 VITALS — BP 126/70 | HR 84 | Temp 98.3°F | Resp 16 | Ht 61.25 in | Wt 181.6 lb

## 2018-08-08 DIAGNOSIS — G8929 Other chronic pain: Secondary | ICD-10-CM

## 2018-08-08 DIAGNOSIS — D126 Benign neoplasm of colon, unspecified: Secondary | ICD-10-CM

## 2018-08-08 DIAGNOSIS — M25562 Pain in left knee: Secondary | ICD-10-CM

## 2018-08-08 DIAGNOSIS — M25561 Pain in right knee: Secondary | ICD-10-CM | POA: Diagnosis not present

## 2018-08-08 DIAGNOSIS — I10 Essential (primary) hypertension: Secondary | ICD-10-CM | POA: Diagnosis not present

## 2018-08-08 DIAGNOSIS — M1731 Unilateral post-traumatic osteoarthritis, right knee: Secondary | ICD-10-CM

## 2018-08-08 MED ORDER — LISINOPRIL-HYDROCHLOROTHIAZIDE 20-12.5 MG PO TABS: 1.0000 | ORAL_TABLET | Freq: Every day | ORAL | 3 refills | Status: DC

## 2018-08-08 NOTE — Progress Notes (Signed)
Subjective  CC:  Chief Complaint  Patient presents with  . Hypertension    HPI: Whitney Hines is a 68 y.o. female who presents to the office today to address the problems listed above in the chief complaint.  Hypertension f/u: Control is good . Pt reports she is doing well. taking medications as instructed, no medication side effects noted, no TIAs, no chest pain on exertion, no dyspnea on exertion, no swelling of ankles. Walking more for exercise. Feels well.  She denies adverse effects from his BP medications. Compliance with medication is good.   C/o cracking and popping in knees. Has pain with climbing stairs. No swelling or redness or give way. Had synvisc injectin in January w/o change in pain. Only has pain with climbing stairs. Rare alleve helps.   HM. Due for colonoscopy: had tubular adenoma in 2017. Has referral. No melena or weight changes or abdominal pain.   Assessment  1. Essential hypertension   2. Chronic pain of both knees   3. Unilateral post-traumatic osteoarthritis, right knee   4. Tubular adenoma of colon      Plan    Hypertension f/u: BP control is well controlled. Continue same meds  Knee pain f/u: no relief from synvisc injection in January. Check xrays today. ? Subpatellar OA. Start VMO exercises, glucosamine and daily tylenol. F/u if not improving.   Pt to call to schedule her overdue colonoscopy.   pneumvax updated  today.   Education regarding management of these chronic disease states was given. Management strategies discussed on successive visits include dietary and exercise recommendations, goals of achieving and maintaining IBW, and lifestyle modifications aiming for adequate sleep and minimizing stressors.   Follow up: Return in about 6 months (around 02/07/2019) for complete physical, follow up Hypertension.  Orders Placed This Encounter  Procedures  . DG Knee AP/LAT W/[place] Left  . DG Knee AP/LAT W/[place]rise Right  . Pneumococcal  polysaccharide vaccine 23-valent greater than or equal to 2yo subcutaneous/IM   Meds ordered this encounter  Medications  . lisinopril-hydrochlorothiazide (ZESTORETIC) 20-12.5 MG tablet    Sig: Take 1 tablet by mouth daily.    Dispense:  90 tablet    Refill:  3      BP Readings from Last 3 Encounters:  08/08/18 126/70  02/24/18 126/84  02/07/18 124/82   Wt Readings from Last 3 Encounters:  08/08/18 181 lb 9.6 oz (82.4 kg)  02/24/18 181 lb 9.6 oz (82.4 kg)  02/07/18 181 lb 6.4 oz (82.3 kg)    Lab Results  Component Value Date   CHOL 158 02/07/2018   CHOL 177 02/25/2017   Lab Results  Component Value Date   HDL 32.80 (L) 02/07/2018   HDL 28.10 (L) 02/25/2017   Lab Results  Component Value Date   LDLCALC 90 02/07/2018   Lab Results  Component Value Date   TRIG 178.0 (H) 02/07/2018   TRIG 261.0 (H) 02/25/2017   Lab Results  Component Value Date   CHOLHDL 5 02/07/2018   CHOLHDL 6 02/25/2017   Lab Results  Component Value Date   LDLDIRECT 87.0 02/25/2017   Lab Results  Component Value Date   CREATININE 1.15 02/07/2018   BUN 22 02/07/2018   NA 138 02/07/2018   K 4.8 02/07/2018   CL 103 02/07/2018   CO2 28 02/07/2018    The 10-year ASCVD risk score Mikey Bussing DC Jr., et al., 2013) is: 8.2%   Values used to calculate the score:  Age: 83 years     Sex: Female     Is Non-Hispanic African American: Yes     Diabetic: No     Tobacco smoker: No     Systolic Blood Pressure: 433 mmHg     Is BP treated: Yes     HDL Cholesterol: 32.8 mg/dL     Total Cholesterol: 158 mg/dL  I reviewed the patients updated PMH, FH, and SocHx.    Patient Active Problem List   Diagnosis Date Noted  . Tubular adenoma of colon 01/10/2015    Priority: High  . BMI 30.0-30.9,adult 12/31/2014    Priority: High  . Essential hypertension 12/31/2014    Priority: High  . Generalized idiopathic epilepsy and epileptic syndromes, without status epilepticus, not intractable ([place]) 05/15/2013     Priority: High  . Osteopenia 04/26/2018  . Low HDL (under 40) 02/25/2017  . Unilateral post-traumatic osteoarthritis, right knee 02/25/2017    Allergies: Dilantin  [phenytoin sodium extended] and Dilantin [phenytoin]  Social History: Patient  reports that she has never smoked. She has never used smokeless tobacco. She reports that she does not drink alcohol or use drugs.  Current Meds  Medication Sig  . lisinopril-hydrochlorothiazide (ZESTORETIC) 20-12.5 MG tablet Take 1 tablet by mouth daily.  Marland Kitchen PHENObarbital (LUMINAL) 100 MG tablet Take 1 tablet (100 mg total) by mouth at bedtime.  . [DISCONTINUED] lisinopril-hydrochlorothiazide (PRINZIDE,ZESTORETIC) 20-12.5 MG tablet TAKE 1 TABLET BY MOUTH EVERY DAY    Review of Systems: Cardiovascular: negative for chest pain, palpitations, leg swelling, orthopnea Respiratory: negative for SOB, wheezing or persistent cough Gastrointestinal: negative for abdominal pain Genitourinary: negative for dysuria or gross hematuria  Objective  Vitals: BP 126/70   Pulse 84   Temp 98.3 F (36.8 C) (Oral)   Resp 16   Ht 5' 1.25" (1.556 m)   Wt 181 lb 9.6 oz (82.4 kg)   LMP  (LMP Unknown) Comment: LMP 2004  SpO2 99%   BMI 34.03 kg/m  General: no acute distress  Psych:  Alert and oriented, normal mood and affect HEENT:  Normocephalic, atraumatic, supple neck  Cardiovascular:  RRR without murmur. no edema Respiratory:  Good breath sounds bilaterally, CTAB with normal respiratory effort Skin:  Warm, no rashes Neurologic:   Mental status is normal Knees: L>R crepitus w/o ttp. FROM. Nl gait  Commons side effects, risks, benefits, and alternatives for medications and treatment plan prescribed today were discussed, and the patient expressed understanding of the given instructions. Patient is instructed to call or message via MyChart if he/she has any questions or concerns regarding our treatment plan. No barriers to understanding were identified. We  discussed Red Flag symptoms and signs in detail. Patient expressed understanding regarding what to do in case of urgent or emergency type symptoms.   Medication list was reconciled, printed and provided to the patient in AVS. Patient instructions and summary information was reviewed with the patient as documented in the AVS. This note was prepared with assistance of Dragon voice recognition software. Occasional wrong-word or sound-a-like substitutions may have occurred due to the inherent limitations of voice recognition software

## 2018-08-08 NOTE — Patient Instructions (Addendum)
Please return in 6 months for follow up of your hypertension and for your annual complete physical; please come fasting.  Your blood pressure looks good! You may use glucosamine twice a day and tylenol twice a day to see if this helps your knee pain. You can use alleve as needed.   If you have any questions or concerns, please don't hesitate to send me a message via MyChart or call the office at [phone]. Thank you for visiting with [place] today! It's our pleasure caring for you.  Today you were given your final Pneumococcal 23 (pneumovax) vaccination.    Osteoarthritis  Osteoarthritis is a type of arthritis that affects tissue that covers the ends of bones in joints (cartilage). Cartilage acts as a cushion between the bones and helps them move smoothly. Osteoarthritis results when cartilage in the joints gets worn down. Osteoarthritis is sometimes called "wear and tear" arthritis. Osteoarthritis is the most common form of arthritis. It often occurs in older people. It is a condition that gets worse over time (a progressive condition). Joints that are most often affected by this condition are in:  Fingers.  Toes.  Hips.  Knees.  Spine, including neck and lower back. What are the causes? This condition is caused by age-related wearing down of cartilage that covers the ends of bones. What increases the risk? The following factors may make you more likely to develop this condition:  Older age.  Being overweight or obese.  Overuse of joints, such as in athletes.  Past injury of a joint.  Past surgery on a joint.  Family history of osteoarthritis. What are the signs or symptoms? The main symptoms of this condition are pain, swelling, and stiffness in the joint. The joint may lose its shape over time. Small pieces of bone or cartilage may break off and float inside of the joint, which may cause more pain and damage to the joint. Small deposits of bone (osteophytes) may grow on the  edges of the joint. Other symptoms may include:  A grating or scraping feeling inside the joint when you move it.  Popping or creaking sounds when you move. Symptoms may affect one or more joints. Osteoarthritis in a major joint, such as your knee or hip, can make it painful to walk or exercise. If you have osteoarthritis in your hands, you might not be able to grip items, twist your hand, or control small movements of your hands and fingers (fine motor skills). How is this diagnosed? This condition may be diagnosed based on:  Your medical history.  A physical exam.  Your symptoms.  X-rays of the affected joint(s).  Blood tests to rule out other types of arthritis. How is this treated? There is no cure for this condition, but treatment can help to control pain and improve joint function. Treatment plans may include:  A prescribed exercise program that allows for rest and joint relief. You may work with a physical therapist.  A weight control plan.  Pain relief techniques, such as: ? Applying heat and cold to the joint. ? Electric pulses delivered to nerve endings under the skin (transcutaneous electrical nerve stimulation, or TENS). ? Massage. ? Certain nutritional supplements.  NSAIDs or prescription medicines to help relieve pain.  Medicine to help relieve pain and inflammation (corticosteroids). This can be given by mouth (orally) or as an injection.  Assistive devices, such as a brace, wrap, splint, specialized glove, or cane.  Surgery, such as: ? An osteotomy. This is done  to reposition the bones and relieve pain or to remove loose pieces of bone and cartilage. ? Joint replacement surgery. You may need this surgery if you have very bad (advanced) osteoarthritis. Follow these instructions at home: Activity  Rest your affected joints as directed by your health care provider.  Do not drive or use heavy machinery while taking prescription pain medicine.  Exercise as  directed. Your health care provider or physical therapist may recommend specific types of exercise, such as: ? Strengthening exercises. These are done to strengthen the muscles that support joints that are affected by arthritis. They can be performed with weights or with exercise bands to add resistance. ? Aerobic activities. These are exercises, such as brisk walking or water aerobics, that get your heart pumping. ? Range-of-motion activities. These keep your joints easy to move. ? Balance and agility exercises. Managing pain, stiffness, and swelling      If directed, apply heat to the affected area as often as told by your health care provider. Use the heat source that your health care provider recommends, such as a moist heat pack or a heating pad. ? If you have a removable assistive device, remove it as told by your health care provider. ? Place a towel between your skin and the heat source. If your health care provider tells you to keep the assistive device on while you apply heat, place a towel between the assistive device and the heat source. ? Leave the heat on for 20-30 minutes. ? Remove the heat if your skin turns bright red. This is especially important if you are unable to feel pain, heat, or cold. You may have a greater risk of getting burned.  If directed, put ice on the affected joint: ? If you have a removable assistive device, remove it as told by your health care provider. ? Put ice in a plastic bag. ? Place a towel between your skin and the bag. If your health care provider tells you to keep the assistive device on during icing, place a towel between the assistive device and the bag. ? Leave the ice on for 20 minutes, 2-3 times a day. General instructions  Take over-the-counter and prescription medicines only as told by your health care provider.  Maintain a healthy weight. Follow instructions from your health care provider for weight control. These may include dietary  restrictions.  Do not use any products that contain nicotine or tobacco, such as cigarettes and e-cigarettes. These can delay bone healing. If you need help quitting, ask your health care provider.  Use assistive devices as directed by your health care provider.  Keep all follow-up visits as told by your health care provider. This is important. Where to find more information  [employer] of Arthritis and Musculoskeletal and Skin Diseases: www.niams.SouthExposed.es  [employer] on Aging: http://kim-miller.com/  American College of Rheumatology: www.rheumatology.org Contact a health care provider if:  Your skin turns red.  You develop a rash.  You have pain that gets worse.  You have a fever along with joint or muscle aches. Get help right away if:  You lose a lot of weight.  You suddenly lose your appetite.  You have night sweats. Summary  Osteoarthritis is a type of arthritis that affects tissue covering the ends of bones in joints (cartilage).  This condition is caused by age-related wearing down of cartilage that covers the ends of bones.  The main symptom of this condition is pain, swelling, and stiffness in the  joint.  There is no cure for this condition, but treatment can help to control pain and improve joint function. This information is not intended to replace advice given to you by your health care provider. Make sure you discuss any questions you have with your health care provider. Document Released: 02/09/2005 Document Revised: 11/16/2016 Document Reviewed: 10/14/2015 Elsevier Interactive Patient Education  2019 [employer].

## 2018-08-09 DIAGNOSIS — Z23 Encounter for immunization: Secondary | ICD-10-CM | POA: Diagnosis not present

## 2018-10-25 ENCOUNTER — Telehealth: Payer: Self-pay | Admitting: Gastroenterology

## 2018-10-25 NOTE — Telephone Encounter (Signed)
DOD 02/07/2018  Cirigl[place], we received a referral for this pt to schedule a colonoscopy.  Pt declined to schedule until now.    Previous colon and path reports (2016) are in Epic for review.  Please advise scheduling.

## 2018-10-26 NOTE — Telephone Encounter (Signed)
Left message on voicemail to relay Dr. Vivia Ewing recommendation and for pt to call back with any symptoms/questions.

## 2018-10-26 NOTE — Telephone Encounter (Signed)
Colonoscopy in 12/2014 by Dr. Wynetta Emery notable for a 5 and 7 mm tubular adenoma.  No follow-up guidance given per available notes in EMR.  However based on guideline recommendations at that time, would have recommended for repeat in 5 years.  Current guideline recommendations would be to repeat in 7 years, based on colonoscopy performed now and moving forward.  With that said, if there is any family history, prior history of more advanced polyps, active GI symptoms, etc., could certainly discuss indications for repeat colonoscopy now.  If she is otherwise asymptomatic, and just scheduling for surveillance, based on the available information we have, would recommend repeat in 2021.    I am happy to schedule her a appointment with me in the clinic to discuss this in further detail.  Thank you.

## 2018-11-07 ENCOUNTER — Ambulatory Visit: Payer: BLUE CROSS/BLUE SHIELD | Admitting: Neurology

## 2018-12-02 ENCOUNTER — Ambulatory Visit: Payer: Self-pay | Admitting: *Deleted

## 2018-12-02 ENCOUNTER — Encounter: Payer: Self-pay | Admitting: Family Medicine

## 2018-12-02 ENCOUNTER — Ambulatory Visit (INDEPENDENT_AMBULATORY_CARE_PROVIDER_SITE_OTHER): Payer: BC Managed Care – PPO | Admitting: Family Medicine

## 2018-12-02 ENCOUNTER — Other Ambulatory Visit: Payer: Self-pay | Admitting: Emergency Medicine

## 2018-12-02 DIAGNOSIS — Z20828 Contact with and (suspected) exposure to other viral communicable diseases: Secondary | ICD-10-CM

## 2018-12-02 DIAGNOSIS — Z20822 Contact with and (suspected) exposure to covid-19: Secondary | ICD-10-CM

## 2018-12-02 DIAGNOSIS — R509 Fever, unspecified: Secondary | ICD-10-CM

## 2018-12-02 NOTE — Patient Instructions (Signed)
Person Under Monitoring Name: Whitney Hines  Location: [place]. [place] [address]   Infection Prevention Recommendations for Individuals Confirmed to have, or Being Evaluated for, 2019 Novel Coronavirus (COVID-19) Infection Who Receive Care at Home  Individuals who are confirmed to have, or are being evaluated for, COVID-19 should follow the prevention steps below until a healthcare provider or local or state health department says they can return to normal activities.  Stay home except to get medical care You should restrict activities outside your home, except for getting medical care. Do not go to work, school, or public areas, and do not use public transportation or taxis.  Call ahead before visiting your doctor Before your medical appointment, call the healthcare provider and tell them that you have, or are being evaluated for, COVID-19 infection. This will help the healthcare provider's office take steps to keep other people from getting infected. Ask your healthcare provider to call the local or state health department.  Monitor your symptoms Seek prompt medical attention if your illness is worsening (e.g., difficulty breathing). Before going to your medical appointment, call the healthcare provider and tell them that you have, or are being evaluated for, COVID-19 infection. Ask your healthcare provider to call the local or state health department.  Wear a facemask You should wear a facemask that covers your nose and mouth when you are in the same room with other people and when you visit a healthcare provider. People who live with or visit you should also wear a facemask while they are in the same room with you.  Separate yourself from other people in your home As much as possible, you should stay in a different room from other people in your home. Also, you should use a separate bathroom, if available.  Avoid sharing household items You should not  share dishes, drinking glasses, cups, eating utensils, towels, bedding, or other items with other people in your home. After using these items, you should wash them thoroughly with soap and water.  Cover your coughs and sneezes Cover your mouth and nose with a tissue when you cough or sneeze, or you can cough or sneeze into your sleeve. Throw used tissues in a lined trash can, and immediately wash your hands with soap and water for at least 20 seconds or use an alcohol-based hand rub.  Wash your [facility] your hands often and thoroughly with soap and water for at least 20 seconds. You can use an alcohol-based hand sanitizer if soap and water are not available and if your hands are not visibly dirty. Avoid touching your eyes, nose, and mouth with unwashed hands.   Prevention Steps for Caregivers and Household Members of Individuals Confirmed to have, or Being Evaluated for, COVID-19 Infection Being Cared for in the Home  If you live with, or provide care at home for, a person confirmed to have, or being evaluated for, COVID-19 infection please follow these guidelines to prevent infection:  Follow healthcare provider's instructions Make sure that you understand and can help the patient follow any healthcare provider instructions for all care.  Provide for the patient's basic needs You should help the patient with basic needs in the home and provide support for getting groceries, prescriptions, and other personal needs.  Monitor the patient's symptoms If they are getting sicker, call his or her medical provider and tell them that the patient has, or is being evaluated for, COVID-19 infection. This will help the healthcare provider's office  take steps to keep other people from getting infected. Ask the healthcare provider to call the local or state health department.  Limit the number of people who have contact with the patient  If possible, have only one caregiver for the patient.   Other household members should stay in another home or place of residence. If this is not possible, they should stay  in another room, or be separated from the patient as much as possible. Use a separate bathroom, if available.  Restrict visitors who do not have an essential need to be in the home.  Keep older adults, very young children, and other sick people away from the patient Keep older adults, very young children, and those who have compromised immune systems or chronic health conditions away from the patient. This includes people with chronic heart, lung, or kidney conditions, diabetes, and cancer.  Ensure good ventilation Make sure that shared spaces in the home have good air flow, such as from an air conditioner or an opened window, weather permitting.  Wash your hands often  Wash your hands often and thoroughly with soap and water for at least 20 seconds. You can use an alcohol based hand sanitizer if soap and water are not available and if your hands are not visibly dirty.  Avoid touching your eyes, nose, and mouth with unwashed hands.  Use disposable paper towels to dry your hands. If not available, use dedicated cloth towels and replace them when they become wet.  Wear a facemask and gloves  Wear a disposable facemask at all times in the room and gloves when you touch or have contact with the patient's blood, body fluids, and/or secretions or excretions, such as sweat, saliva, sputum, nasal mucus, vomit, urine, or feces.  Ensure the mask fits over your nose and mouth tightly, and do not touch it during use.  Throw out disposable facemasks and gloves after using them. Do not reuse.  Wash your hands immediately after removing your facemask and gloves.  If your personal clothing becomes contaminated, carefully remove clothing and launder. Wash your hands after handling contaminated clothing.  Place all used disposable facemasks, gloves, and other waste in a lined container  before disposing them with other household waste.  Remove gloves and wash your hands immediately after handling these items.  Do not share dishes, glasses, or other household items with the patient  Avoid sharing household items. You should not share dishes, drinking glasses, cups, eating utensils, towels, bedding, or other items with a patient who is confirmed to have, or being evaluated for, COVID-19 infection.  After the person uses these items, you should wash them thoroughly with soap and water.  Wash laundry thoroughly  Immediately remove and wash clothes or bedding that have blood, body fluids, and/or secretions or excretions, such as sweat, saliva, sputum, nasal mucus, vomit, urine, or feces, on them.  Wear gloves when handling laundry from the patient.  Read and follow directions on labels of laundry or clothing items and detergent. In general, wash and dry with the warmest temperatures recommended on the label.  Clean all areas the individual has used often  Clean all touchable surfaces, such as counters, tabletops, doorknobs, bathroom fixtures, toilets, phones, keyboards, tablets, and bedside tables, every day. Also, clean any surfaces that may have blood, body fluids, and/or secretions or excretions on them.  Wear gloves when cleaning surfaces the patient has come in contact with.  Use a diluted bleach solution (e.g., dilute bleach with 1 part  bleach and 10 parts water) or a household disinfectant with a label that says EPA-registered for coronaviruses. To make a bleach solution at home, add 1 tablespoon of bleach to 1 quart (4 cups) of water. For a larger supply, add  cup of bleach to 1 gallon (16 cups) of water.  Read labels of cleaning products and follow recommendations provided on product labels. Labels contain instructions for safe and effective use of the cleaning product including precautions you should take when applying the product, such as wearing gloves or eye  protection and making sure you have good ventilation during use of the product.  Remove gloves and wash hands immediately after cleaning.  Monitor yourself for signs and symptoms of illness Caregivers and household members are considered close contacts, should monitor their health, and will be asked to limit movement outside of the home to the extent possible. Follow the monitoring steps for close contacts listed on the symptom monitoring form.   ? If you have additional questions, contact your local health department or call the epidemiologist on call at [phone] (available 24/7). ? This guidance is subject to change. For the most up-to-date guidance from [facility], please refer to their website: [email or web address]

## 2018-12-02 NOTE — Progress Notes (Signed)
     Virtual Visit via Video Note  Subjective  CC:  Chief Complaint  Patient presents with  . Cough    husband is covid +     I connected with Angela Nevin on 12/02/18 at  3:40 PM EDT by a video enabled telemedicine application and verified that I am speaking with the correct person using two identifiers. Location patient: Home Location provider: [facility] [place], Office Persons participating in the virtual visit: King, Tier, MD Janeann Forehand, RN  I discussed the limitations of evaluation and management by telemedicine and the availability of in person appointments. The patient expressed understanding and agreed to proceed. HPI: Whitney Hines is a 68 y.o. female who was contacted today to address the problems listed above in the chief complaint. . 68 yo with malaise that started 2 days ago, today with fever to 102, fatigue. No cough or sob. + loss of taste. Husband diagnosed with covid yesterday. No cp. No GI sxs.  Assessment  1. Fever with exposure to COVID-19 virus      Plan   Covid infection clinically:  Sent for testing; discussed isolation and prevention of spread. No work until 10 days sx free. Monitor for sob or worsening sxs. Hydrate, rest. OTC supportive meds.   I discussed the assessment and treatment plan with the patient. The patient was provided an opportunity to ask questions and all were answered. The patient agreed with the plan and demonstrated an understanding of the instructions.   The patient was advised to call back or seek an in-person evaluation if the symptoms worsen or if the condition fails to improve as anticipated. Follow up: prn  Visit date not found  No orders of the defined types were placed in this encounter.     I reviewed the patients updated PMH, FH, and SocHx.    Patient Active Problem List   Diagnosis Date Noted  . Tubular adenoma of colon 01/10/2015    Priority: High  . BMI  30.0-30.9,adult 12/31/2014    Priority: High  . Essential hypertension 12/31/2014    Priority: High  . Generalized idiopathic epilepsy and epileptic syndromes, without status epilepticus, not intractable ([place]) 05/15/2013    Priority: High  . Osteopenia 04/26/2018  . Low HDL (under 40) 02/25/2017  . Unilateral post-traumatic osteoarthritis, right knee 02/25/2017   No outpatient medications have been marked as taking for the 12/02/18 encounter (Office Visit) with Leamon Arnt, MD.    Allergies: Patient is allergic to dilantin  [phenytoin sodium extended] and dilantin [phenytoin]. Family History: Patient family history includes Heart attack in her mother; Heart disease in her mother; Hypertension in her father; Stroke in her paternal aunt, paternal aunt, and paternal uncle. Social History:  Patient  reports that she has never smoked. She has never used smokeless tobacco. She reports that she does not drink alcohol or use drugs.  Review of Systems: Constitutional: Negative for fever malaise or anorexia Cardiovascular: negative for chest pain Respiratory: negative for SOB or persistent cough Gastrointestinal: negative for abdominal pain  OBJECTIVE Vitals: LMP  (LMP Unknown) Comment: LMP 2004 General: no acute distress , A&Ox3  Leamon Arnt, MD

## 2018-12-02 NOTE — Telephone Encounter (Signed)
Pt calling with complaints of feeling tired, a cough on yesterday and a fever of 102.3 at 4 am this morning. Pt has not checked temperature recently. Pt states that her husband went to Urgent Care on yesterday and found out this morning that he was positive for COVID-19. Home Care advice given and call transferred to [place] in the office so that virtual visit could be scheduled.  Reason for Disposition . [1] COVID-19 infection suspected by caller or triager AND [2] mild symptoms (cough, fever, or others) AND [id number] no complications or SOB  Answer Assessment - Initial Assessment Questions 1. CLOSE CONTACT: "Who is the person with the confirmed or suspected COVID-19 infection that you were exposed to?"     husband 2. PLACE of CONTACT: "Where were you when you were exposed to COVID-19?" (e.g., home, school, medical waiting room; which city?)     home 3. TYPE of CONTACT: "How much contact was there?" (e.g., sitting next to, live in same house, work in same office, same building)    Lives in the house with husband 4. DURATION of CONTACT: "How long were you in contact with the COVID-19 patient?" (e.g., a few seconds, passed by person, a few minutes, live with the patient)     Lives with the positive pat 5. DATE of CONTACT: "When did you have contact with a COVID-19 patient?" (e.g., how many days ago)     Found out that husband was positive today 6. TRAVEL: "Have you traveled out of the country recently?" If so, "When and where?"     * Also ask about out-of-state travel, since the CDC has identified some high-risk cities for community spread in the [place].     * Note: Travel becomes less relevant if there is widespread community transmission where the patient lives.     No 7. COMMUNITY SPREAD: "Are there lots of cases of COVID-19 (community spread) where you live?" (See public health department website, if unsure)       yes 8. SYMPTOMS: "Do you have any symptoms?" (e.g., fever, cough, breathing  difficulty)     Cough, fever, loss of taste 9. PREGNANCY OR POSTPARTUM: "Is there any chance you are pregnant?" "When was your last menstrual period?" "Did you deliver in the last 2 weeks?"     n/a 10. HIGH RISK: "Do you have any heart or lung problems? Do you have a weak immune system?" (e.g., CHF, COPD, asthma, HIV positive, chemotherapy, renal failure, diabetes mellitus, sickle cell anemia)       No  Protocols used: CORONAVIRUS (COVID-19) DIAGNOSED OR SUSPECTED-A-AH, CORONAVIRUS (COVID-19) EXPOSURE-A-AH

## 2018-12-02 NOTE — Telephone Encounter (Signed)
See note.  Patient scheduled for virtual visit.

## 2018-12-03 LAB — NOVEL CORONAVIRUS, NAA: SARS-CoV-2, NAA: DETECTED — AB

## 2018-12-05 NOTE — Progress Notes (Signed)
Please call patient: I have reviewed his/her lab results. Her test for covid is positive as suspected. Instructions for home isolation were discussed and are in her AVS that she can see on Mychart. Monitor for sob.

## 2018-12-15 ENCOUNTER — Encounter: Payer: Self-pay | Admitting: Family Medicine

## 2018-12-15 ENCOUNTER — Ambulatory Visit (INDEPENDENT_AMBULATORY_CARE_PROVIDER_SITE_OTHER): Payer: BC Managed Care – PPO | Admitting: Family Medicine

## 2018-12-15 VITALS — Temp 98.4°F

## 2018-12-15 DIAGNOSIS — U071 COVID-19: Secondary | ICD-10-CM | POA: Diagnosis not present

## 2018-12-15 MED ORDER — GUAIFENESIN-CODEINE 100-10 MG/5ML PO SOLN: 5.0000 mL | Freq: Four times a day (QID) | ORAL | 0 refills | Status: DC | PRN

## 2018-12-15 NOTE — Progress Notes (Signed)
Virtual Visit via Video Note  Subjective  CC:  Chief Complaint  Patient presents with  . COVID cough    Was DX with COVID 2 weeks ago and still having bad cough and short breaths.. Reports she has been taking Robitussin and Tylenol    I connected with Angela Nevin on 12/15/18 at  4:00 PM EDT by a video enabled telemedicine application and verified that I am speaking with the correct person using two identifiers. Location patient: Home Location provider: [facility] [place], Office Persons participating in the virtual visit: Cozie, [place], MD Lilli Light, [place] discussed the limitations of evaluation and management by telemedicine and the availability of in person appointments. The patient expressed understanding and agreed to proceed. HPI: Whitney Hines is a 68 y.o. female who was contacted today to address the problems listed above in the chief complaint. . covid infection; recovering but still hacking all night long in spite of otc medications. Husband reports she seems winded with walking. She denies SOB at rest. Feels fatigued. No longer with fevers. Eating is fair; hydrating well. No n/v or diarrhea.   Assessment  1. COVID-19 virus infection      Plan   covid 19:  Treat supportively with cough meds. [facility] called in. Educated on need for UC or ER evaluation IF she feels SOB or having difficulty breathing. Appeared unlabored during visit.  I discussed the assessment and treatment plan with the patient. The patient was provided an opportunity to ask questions and all were answered. The patient agreed with the plan and demonstrated an understanding of the instructions.   The patient was advised to call back or seek an in-person evaluation if the symptoms worsen or if the condition fails to improve as anticipated. Follow up: prn  Visit date not found  No orders of the defined types were placed in this encounter.    I reviewed  the patients updated PMH, FH, and SocHx.    Patient Active Problem List   Diagnosis Date Noted  . Tubular adenoma of colon 01/10/2015    Priority: High  . BMI 30.0-30.9,adult 12/31/2014    Priority: High  . Essential hypertension 12/31/2014    Priority: High  . Generalized idiopathic epilepsy and epileptic syndromes, without status epilepticus, not intractable ([place]) 05/15/2013    Priority: High  . Osteopenia 04/26/2018  . Low HDL (under 40) 02/25/2017  . Unilateral post-traumatic osteoarthritis, right knee 02/25/2017   Current Meds  Medication Sig  . lisinopril-hydrochlorothiazide (ZESTORETIC) 20-12.5 MG tablet Take 1 tablet by mouth daily.  Marland Kitchen PHENObarbital (LUMINAL) 100 MG tablet Take 1 tablet (100 mg total) by mouth at bedtime.    Allergies: Patient is allergic to dilantin  [phenytoin sodium extended] and dilantin [phenytoin]. Family History: Patient family history includes Heart attack in her mother; Heart disease in her mother; Hypertension in her father; Stroke in her paternal aunt, paternal aunt, and paternal uncle. Social History:  Patient  reports that she has never smoked. She has never used smokeless tobacco. She reports that she does not drink alcohol or use drugs.  Review of Systems: Constitutional: Negative for fever malaise or anorexia Cardiovascular: negative for chest pain Respiratory: + for SOB or persistent cough Gastrointestinal: negative for abdominal pain  OBJECTIVE Vitals: Temp 98.4 F (36.9 C) (Oral)   LMP  (LMP Unknown) Comment: LMP 2004 General: no acute distress , A&Ox3, no respiratory distress    Reche Dixon, MD

## 2018-12-19 ENCOUNTER — Ambulatory Visit: Payer: BLUE CROSS/BLUE SHIELD | Admitting: Neurology

## 2018-12-20 ENCOUNTER — Encounter: Payer: Self-pay | Admitting: Family Medicine

## 2019-01-11 ENCOUNTER — Encounter: Payer: Self-pay | Admitting: Family Medicine

## 2019-01-11 NOTE — Telephone Encounter (Signed)
Ok to fill out forms when received or do you need a visit.

## 2019-01-16 NOTE — Progress Notes (Signed)
PATIENT: Whitney Hines DOB: 1950-03-19  REASON FOR VISIT: follow up HISTORY FROM: patient  HISTORY OF PRESENT ILLNESS: Today 01/17/19  Whitney Hines is a 68 year old female with history of seizure disorder.  Her last seizure occurred in 1999.  She remains on phenobarbital. She is tolerating the medication without side effect.  She has continued to work full-time at [employer].  She lives with her husband, drives a car.  She denies any issues with balance or falls.  She denies any new problems or concerns.  She presents today for follow-up unaccompanied.  HISTORY   HISTORY OF PRESENT ILLNESS 11/04/2018 CM:Whitney Hines , 68 year old female returns for followup. She has a history of seizure disorder with last seizure occurring in 1999. She denies any difficulty tolerating her phenobarbital. She returns for reevaluation. She needs refills and labs . She has lost 1 lb in the last year, but B/P in better controlled.  She continues to work full-time at [employer].  REVIEW OF SYSTEMS: Out of a complete 14 system review of symptoms, the patient complains only of the following symptoms, and all other reviewed systems are negative.  Seizures  ALLERGIES: Allergies  Allergen Reactions  . Dilantin  [Phenytoin Sodium Extended] Hives  . Dilantin [Phenytoin] Hives    HOME MEDICATIONS: Outpatient Medications Prior to Visit  Medication Sig Dispense Refill  . lisinopril-hydrochlorothiazide (ZESTORETIC) 20-12.5 MG tablet Take 1 tablet by mouth daily. 90 tablet 3  . PHENObarbital (LUMINAL) 100 MG tablet Take 1 tablet (100 mg total) by mouth at bedtime. 90 tablet 1  . guaiFENesin-codeine 100-10 MG/5ML syrup Take 5 mLs by mouth every 6 (six) hours as needed for cough. 120 mL 0   No facility-administered medications prior to visit.     PAST MEDICAL HISTORY: Past Medical History:  Diagnosis Date  . High blood pressure   . Neuropathy   . Osteopenia 04/26/2018   dexa 03/2018: lowest T = -1.6  femur. Recheck 2 years  . Seizure ([place]) oct 1999    PAST SURGICAL HISTORY: Past Surgical History:  Procedure Laterality Date  . COLONOSCOPY WITH PROPOFOL N/A 01/07/2015   Procedure: COLONOSCOPY WITH PROPOFOL;  Surgeon: Garlan Fair, MD;  Location: [facility];  Service: Endoscopy;  Laterality: N/A;  . MULTIPLE TOOTH EXTRACTIONS      FAMILY HISTORY: Family History  Problem Relation Age of Onset  . Heart disease Mother   . Heart attack Mother   . Hypertension Father   . Stroke Paternal Aunt   . Stroke Paternal Uncle   . Stroke Paternal Aunt     SOCIAL HISTORY: Social History   Socioeconomic History  . Marital status: Married    Spouse name: Not on file  . Number of children: 0  . Years of education: BS  . Highest education level: Not on file  Occupational History  . Occupation: Buyer, retail: [employer]  . Financial resource strain: Not on file  . Food insecurity    Worry: Not on file    Inability: Not on file  . Transportation needs    Medical: Not on file    Non-medical: Not on file  Tobacco Use  . Smoking status: Never Smoker  . Smokeless tobacco: Never Used  Substance and Sexual Activity  . Alcohol use: No    Alcohol/week: 0.0 standard drinks  . Drug use: No  . Sexual activity: Yes  Lifestyle  . Physical activity    Days per week:  Not on file    Minutes per session: Not on file  . Stress: Not on file  Relationships  . Social Herbalist on phone: Not on file    Gets together: Not on file    Attends religious service: Not on file    Active member of club or organization: Not on file    Attends meetings of clubs or organizations: Not on file    Relationship status: Not on file  . Intimate partner violence    Fear of current or ex partner: Not on file    Emotionally abused: Not on file    Physically abused: Not on file    Forced sexual activity: Not on file  Other Topics Concern  . 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    Patient has BS degree.    Patient does not have any children.    Patient drinks 8 ounces of caffeine daily.    Patient is left handed.       PHYSICAL EXAM  Vitals:   01/17/19 1540  BP: 130/82  Pulse: 100  Temp: (!) 96.9 F (36.1 C)  Weight: 167 lb 6.4 oz (75.9 kg)  Height: 5\' 2"  (1.575 m)   Body mass index is 30.62 kg/m.  Generalized: Well developed, in no acute distress   Neurological examination  Mentation: Alert oriented to time, place, history taking. Follows all commands speech and language fluent Cranial nerve II-XII: Pupils were equal round reactive to light. Extraocular movements were full, visual field were full on confrontational test. Facial sensation and strength were normal. Head turning and shoulder shrug  were normal and symmetric. Motor: The motor testing reveals 5 over 5 strength of all 4 extremities. Good symmetric motor tone is noted throughout.  Sensory: Sensory testing is intact to soft touch on all 4 extremities. No evidence of extinction is noted.  Coordination: Cerebellar testing reveals good finger-nose-finger and heel-to-shin bilaterally.  Gait and station: Gait is normal. Tandem gait is normal. Romberg is negative. No drift is seen.  Reflexes: Deep tendon reflexes are symmetric and normal bilaterally.   DIAGNOSTIC DATA (LABS, IMAGING, TESTING) - I reviewed patient records, labs, notes, testing and imaging myself where available.  Lab Results  Component Value Date   WBC 6.6 02/07/2018   HGB 12.6 02/07/2018   HCT 37.6 02/07/2018   MCV 92.6 02/07/2018   PLT 269.0 02/07/2018      Component Value Date/Time   NA 138 02/07/2018 0944   NA 139 11/03/2017 1344   K 4.8 02/07/2018 0944   CL 103 02/07/2018 0944   CO2 28 02/07/2018 0944   GLUCOSE 80 02/07/2018 0944   BUN 22 02/07/2018 0944   BUN 18 11/03/2017 1344    CREATININE 1.15 02/07/2018 0944   CALCIUM 9.8 02/07/2018 0944   PROT 8.7 (H) 02/07/2018 0944   PROT 8.4 11/03/2017 1344   ALBUMIN 4.0 02/07/2018 0944   ALBUMIN 4.2 11/03/2017 1344   AST 23 02/07/2018 0944   ALT 15 02/07/2018 0944   ALKPHOS 124 (H) 02/07/2018 0944   BILITOT 0.3 02/07/2018 0944   BILITOT 0.3 11/03/2017 1344   GFRNONAA 53 (L) 11/03/2017 1344   GFRAA 62 11/03/2017 1344   Lab Results  Component Value Date   CHOL 158 02/07/2018   HDL 32.80 (L) 02/07/2018   LDLCALC 90 02/07/2018  LDLDIRECT 87.0 02/25/2017   TRIG 178.0 (H) 02/07/2018   CHOLHDL 5 02/07/2018   No results found for: HGBA1C No results found for: VITAMINB12 No results found for: TSH  ASSESSMENT AND PLAN 68 y.o. year old female  has a past medical history of High blood pressure, Neuropathy, Osteopenia (04/26/2018), and Seizure ([place]) (oct 1999). here with:  1.  History of seizures, well controlled  She has continued to do well, has not had recurrent seizure since 1999.  She will remain on phenobarbital 100 mg at bedtime.  I will check routine lab work, along with a phenobarbital level.  I will refill the medication.  She will follow-up in 1 year or sooner if needed.  I did advise if her symptoms worsen or she develops any new symptoms she should let us know.  I spent 15 minutes with the patient. 50% of this time was spent discussing her plan of care.  Butler Denmark, AGNP-C, DNP 01/17/2019, 3:51 PM [facility] [address] [place], [place] [address] [phone]

## 2019-01-17 ENCOUNTER — Ambulatory Visit: Payer: BC Managed Care – PPO | Admitting: Neurology

## 2019-01-17 ENCOUNTER — Other Ambulatory Visit: Payer: Self-pay

## 2019-01-17 ENCOUNTER — Encounter: Payer: Self-pay | Admitting: Neurology

## 2019-01-17 VITALS — BP 130/82 | HR 100 | Temp 96.9°F | Ht 62.0 in | Wt 167.4 lb

## 2019-01-17 DIAGNOSIS — G40309 Generalized idiopathic epilepsy and epileptic syndromes, not intractable, without status epilepticus: Secondary | ICD-10-CM

## 2019-01-17 MED ORDER — PHENOBARBITAL 100 MG PO TABS: 100.0000 mg | ORAL_TABLET | Freq: Every day | ORAL | 1 refills | Status: DC

## 2019-01-17 NOTE — Progress Notes (Signed)
I have read the note, and I agree with the clinical assessment and plan.  Whitney Hines Whitney Hines   

## 2019-01-17 NOTE — Patient Instructions (Signed)
You look great today! Continue current dose of Phenobarbital. I will check lab work today. See you in 1 year! Happy Thanksgiving!

## 2019-01-18 ENCOUNTER — Telehealth: Payer: Self-pay | Admitting: *Deleted

## 2019-01-18 LAB — CBC WITH DIFFERENTIAL/PLATELET
Basophils Absolute: 0 10*3/uL (ref 0.0–0.2)
Basos: 0 %
EOS (ABSOLUTE): 0 10*3/uL (ref 0.0–0.4)
Eos: 0 %
Hematocrit: 33.3 % — ABNORMAL LOW (ref 34.0–46.6)
Hemoglobin: 11.3 g/dL (ref 11.1–15.9)
Immature Grans (Abs): 0 10*3/uL (ref 0.0–0.1)
Immature Granulocytes: 0 %
Lymphocytes Absolute: 2.9 10*3/uL (ref 0.7–3.1)
Lymphs: 34 %
MCH: 30.8 pg (ref 26.6–33.0)
MCHC: 33.9 g/dL (ref 31.5–35.7)
MCV: 91 fL (ref 79–97)
Monocytes Absolute: 0.5 10*3/uL (ref 0.1–0.9)
Monocytes: 6 %
Neutrophils Absolute: 5.1 10*3/uL (ref 1.4–7.0)
Neutrophils: 60 %
Platelets: 294 10*3/uL (ref 150–450)
RBC: 3.67 x10E6/uL — ABNORMAL LOW (ref 3.77–5.28)
RDW: 13.8 % (ref 11.7–15.4)
WBC: 8.6 10*3/uL (ref 3.4–10.8)

## 2019-01-18 LAB — PHENOBARBITAL LEVEL: Phenobarbital, Serum: 25 ug/mL (ref 15–40)

## 2019-01-18 LAB — COMPREHENSIVE METABOLIC PANEL
ALT: 21 IU/L (ref 0–32)
AST: 26 IU/L (ref 0–40)
Albumin/Globulin Ratio: 0.9 — ABNORMAL LOW (ref 1.2–2.2)
Albumin: 3.9 g/dL (ref 3.8–4.8)
Alkaline Phosphatase: 144 IU/L — ABNORMAL HIGH (ref 39–117)
BUN/Creatinine Ratio: 18 (ref 12–28)
BUN: 23 mg/dL (ref 8–27)
Bilirubin Total: <0.2 mg/dL (ref 0.0–1.2)
CO2: 21 mmol/L (ref 20–29)
Calcium: 9.7 mg/dL (ref 8.7–10.3)
Chloride: 104 mmol/L (ref 96–106)
Creatinine, Ser: 1.26 mg/dL — ABNORMAL HIGH (ref 0.57–1.00)
GFR calc Af Amer: 51 mL/min/{1.73_m2} — ABNORMAL LOW (ref >59)
GFR calc non Af Amer: 45 mL/min/{1.73_m2} — ABNORMAL LOW (ref >59)
Globulin, Total: 4.4 g/dL (ref 1.5–4.5)
Glucose: 82 mg/dL (ref 65–99)
Potassium: 4.5 mmol/L (ref 3.5–5.2)
Sodium: 142 mmol/L (ref 134–144)
Total Protein: 8.3 g/dL (ref 6.0–8.5)

## 2019-01-18 NOTE — Telephone Encounter (Signed)
Spoke with patient and informed her labs show elevated creatinine 1.26, increased from last time. I advised she be sure to drink plenty of water. Her Alkaline phosphatase is elevated 144, and the NP will monitor overtime. She has a good level of phenobarbital. Patient verbalized understanding, appreciation.

## 2019-04-16 ENCOUNTER — Ambulatory Visit: Payer: BC Managed Care – PPO | Attending: Internal Medicine

## 2019-04-16 DIAGNOSIS — Z23 Encounter for immunization: Secondary | ICD-10-CM | POA: Insufficient documentation

## 2019-04-16 NOTE — Progress Notes (Signed)
   Covid-19 Vaccination Clinic  Name:  Nieshia Garrod    MRN: [id number] DOB: 1950-11-12  04/16/2019  Ms. Dowis was observed post Covid-19 immunization for 15 minutes without incidence. She was provided with Vaccine Information Sheet and instruction to access the V-Safe system.   Ms. Campopiano was instructed to call 911 with any severe reactions post vaccine: Marland Kitchen Difficulty breathing  . Swelling of your face and throat  . A fast heartbeat  . A bad rash all over your body  . Dizziness and weakness    Immunizations Administered    Name Date Dose VIS Date Route   Pfizer COVID-19 Vaccine 04/16/2019  3:37 PM 0.3 mL 02/03/2019 Intramuscular   Manufacturer: [place]   Lot: [id number]   [place]: [id number]

## 2019-05-08 ENCOUNTER — Telehealth: Payer: Self-pay | Admitting: Neurology

## 2019-05-08 NOTE — Telephone Encounter (Signed)
Pt is asking for a call with the name/type of the seizure she had in 1999, please call

## 2019-05-09 NOTE — Telephone Encounter (Signed)
done

## 2019-05-09 NOTE — Telephone Encounter (Signed)
I called pt and LMVM that from records, generalized convulsive seizure. ICD9 345.10.  She is to call back if questions.

## 2019-05-09 NOTE — Telephone Encounter (Signed)
Notes from [facility] in your inbox.

## 2019-05-10 ENCOUNTER — Ambulatory Visit: Payer: BC Managed Care – PPO | Admitting: Neurology

## 2019-05-10 ENCOUNTER — Ambulatory Visit: Payer: BC Managed Care – PPO | Attending: Internal Medicine

## 2019-05-10 DIAGNOSIS — Z23 Encounter for immunization: Secondary | ICD-10-CM

## 2019-05-10 NOTE — Progress Notes (Signed)
   Covid-19 Vaccination Clinic  Name:  Whitney Hines    MRN: [id number] DOB: 1950-06-13  05/10/2019  [name] was observed post Covid-19 immunization for 15 minutes without incident. She was provided with Vaccine Information Sheet and instruction to access the V-Safe system.   [name] was instructed to call 911 with any severe reactions post vaccine: Marland Kitchen Difficulty breathing  . Swelling of face and throat  . A fast heartbeat  . A bad rash all over body  . Dizziness and weakness   Immunizations Administered    Name Date Dose VIS Date Route   Pfizer COVID-19 Vaccine 05/10/2019 10:50 AM 0.3 mL 02/03/2019 Intramuscular   Manufacturer: [place]   Lot: [id number]   [place]: [id number]

## 2019-06-01 ENCOUNTER — Other Ambulatory Visit: Payer: Self-pay | Admitting: Family Medicine

## 2019-06-01 DIAGNOSIS — Z1231 Encounter for screening mammogram for malignant neoplasm of breast: Secondary | ICD-10-CM

## 2019-06-06 ENCOUNTER — Encounter: Payer: Self-pay | Admitting: Family Medicine

## 2019-07-13 ENCOUNTER — Ambulatory Visit (INDEPENDENT_AMBULATORY_CARE_PROVIDER_SITE_OTHER): Payer: BC Managed Care – PPO | Admitting: Neurology

## 2019-07-13 ENCOUNTER — Encounter: Payer: Self-pay | Admitting: Neurology

## 2019-07-13 ENCOUNTER — Other Ambulatory Visit: Payer: Self-pay

## 2019-07-13 VITALS — BP 121/68 | HR 65 | Ht 61.0 in | Wt 158.0 lb

## 2019-07-13 DIAGNOSIS — G40309 Generalized idiopathic epilepsy and epileptic syndromes, not intractable, without status epilepticus: Secondary | ICD-10-CM | POA: Diagnosis not present

## 2019-07-13 MED ORDER — PHENOBARBITAL 100 MG PO TABS: 100.0000 mg | ORAL_TABLET | Freq: Every day | ORAL | 1 refills | Status: DC

## 2019-07-13 NOTE — Patient Instructions (Signed)
It was nice to see you today! Continue current medications Check blood work today  See you back in 1 year

## 2019-07-13 NOTE — Progress Notes (Signed)
PATIENT: Whitney Hines DOB: [birth year]  REASON FOR VISIT: follow up HISTORY FROM: patient  HISTORY OF PRESENT ILLNESS: Today 07/13/19  [name] is a 69 year old female history of seizure disorder.  Her last seizure was in 1999.  She remains on phenobarbital.  She is tolerating well.  She continues to work full-time [employer].  She lives with her husband, drives a car.  She has lost about 30 pounds she reports.  She denies any new problems or concerns.  She is doing well.  Presents today for follow-up unaccompanied.  HISTORY 01/17/2019 SS: [name] is a 69 year old female with history of seizure disorder.  Her last seizure occurred in 1999.  She remains on phenobarbital. She is tolerating the medication without side effect.  She has continued to work full-time at [employer].  She lives with her husband, drives a car.  She denies any issues with balance or falls.  She denies any new problems or concerns.  She presents today for follow-up unaccompanied.   REVIEW OF SYSTEMS: Out of a complete 14 system review of symptoms, the patient complains only of the following symptoms, and all other reviewed systems are negative.  Seizure  ALLERGIES: Allergies  Allergen Reactions  . Dilantin  [Phenytoin Sodium Extended] Hives  . Dilantin [Phenytoin] Hives    HOME MEDICATIONS: Outpatient Medications Prior to Visit  Medication Sig Dispense Refill  . lisinopril-hydrochlorothiazide (ZESTORETIC) 20-12.5 MG tablet Take 1 tablet by mouth daily. 90 tablet 3  . PHENObarbital (LUMINAL) 100 MG tablet Take 1 tablet (100 mg total) by mouth at bedtime. 90 tablet 1   No facility-administered medications prior to visit.    PAST MEDICAL HISTORY: Past Medical History:  Diagnosis Date  . High blood pressure   . Neuropathy   . Osteopenia 04/26/2018   dexa 03/2018: lowest T = -1.6 femur. Recheck 2 years  . Seizure ([place]) oct 1999    PAST SURGICAL HISTORY: Past Surgical History:  Procedure  Laterality Date  . COLONOSCOPY WITH PROPOFOL N/A 01/07/2015   Procedure: COLONOSCOPY WITH PROPOFOL;  Surgeon: Garlan Fair, MD;  Location: [facility];  Service: Endoscopy;  Laterality: N/A;  . MULTIPLE TOOTH EXTRACTIONS      FAMILY HISTORY: Family History  Problem Relation Age of Onset  . Heart disease Mother   . Heart attack Mother   . Hypertension Father   . Stroke Paternal Aunt   . Stroke Paternal Uncle   . Stroke Paternal Aunt     SOCIAL HISTORY: Social History   Socioeconomic History  . Marital status: Married    Spouse name: Not on file  . Number of children: 0  . Years of education: BS  . Highest education level: Not on file  Occupational History  . Occupation: Buyer, retail: [employer]  Tobacco Use  . Smoking status: Never Smoker  . Smokeless tobacco: Never Used  Substance and Sexual Activity  . Alcohol use: No    Alcohol/week: 0.0 standard drinks  . Drug use: No  . Sexual activity: Yes  Other Topics Concern  . 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    Patient has BS degree.    Patient does not have any children.    Patient drinks 8 ounces of caffeine daily.    Patient is left handed.    Social Determinants of Health  Financial Resource Strain:   . Difficulty of Paying Living Expenses:   Food Insecurity:   . Worried About Charity fundraiser in the Last Year:   . Arboriculturist in the Last Year:   Transportation Needs:   . Film/video editor (Medical):   Marland Kitchen Lack of Transportation (Non-Medical):   Physical Activity:   . Days of Exercise per Week:   . Minutes of Exercise per Session:   Stress:   . Feeling of Stress :   Social Connections:   . Frequency of Communication with Friends and Family:   . Frequency of Social Gatherings with Friends and Family:   . Attends Religious Services:   . Active Member of Clubs  or Organizations:   . Attends Archivist Meetings:   Marland Kitchen Marital Status:   Intimate Partner Violence:   . Fear of Current or Ex-Partner:   . Emotionally Abused:   Marland Kitchen Physically Abused:   . Sexually Abused:    PHYSICAL EXAM  Vitals:   07/13/19 1450  BP: 121/68  Pulse: 65  Weight: 158 lb (71.7 kg)  Height: 5\' 1"  (1.549 m)   Body mass index is 29.85 kg/m.  Generalized: Well developed, in no acute distress   Neurological examination  Mentation: Alert oriented to time, place, history taking. Follows all commands speech and language fluent Cranial nerve II-XII: Pupils were equal round reactive to light. Extraocular movements were full, visual field were full on confrontational test. Facial sensation and strength were normal.  Head turning and shoulder shrug  were normal and symmetric. Motor: The motor testing reveals 5 over 5 strength of all 4 extremities. Good symmetric motor tone is noted throughout.  Sensory: Sensory testing is intact to soft touch on all 4 extremities. No evidence of extinction is noted.  Coordination: Cerebellar testing reveals good finger-nose-finger and heel-to-shin bilaterally.  Gait and station: Gait is normal. Tandem gait is normal. Romberg is negative. No drift is seen.  Reflexes: Deep tendon reflexes are symmetric and normal bilaterally.   DIAGNOSTIC DATA (LABS, IMAGING, TESTING) - I reviewed patient records, labs, notes, testing and imaging myself where available.  Lab Results  Component Value Date   WBC 8.6 01/17/2019   HGB 11.3 01/17/2019   HCT 33.3 (L) 01/17/2019   MCV 91 01/17/2019   PLT 294 01/17/2019      Component Value Date/Time   NA 142 01/17/2019 1607   K 4.5 01/17/2019 1607   CL 104 01/17/2019 1607   CO2 21 01/17/2019 1607   GLUCOSE 82 01/17/2019 1607   GLUCOSE 80 02/07/2018 0944   BUN 23 01/17/2019 1607   CREATININE 1.26 (H) 01/17/2019 1607   CALCIUM 9.7 01/17/2019 1607   PROT 8.3 01/17/2019 1607   ALBUMIN 3.9  01/17/2019 1607   AST 26 01/17/2019 1607   ALT 21 01/17/2019 1607   ALKPHOS 144 (H) 01/17/2019 1607   BILITOT <0.2 01/17/2019 1607   GFRNONAA 45 (L) 01/17/2019 1607   GFRAA 51 (L) 01/17/2019 1607   Lab Results  Component Value Date   CHOL 158 02/07/2018   HDL 32.80 (L) 02/07/2018   LDLCALC 90 02/07/2018   LDLDIRECT 87.0 02/25/2017   TRIG 178.0 (H) 02/07/2018   CHOLHDL 5 02/07/2018   No results found for: HGBA1C No results found for: VITAMINB12 No results found for: TSH    ASSESSMENT AND PLAN 69 y.o. year old female  has a past medical history of High blood pressure, Neuropathy, Osteopenia (04/26/2018), and Seizure (  [place]) (oct 1999). here with:  1.  History of seizures, well controlled  She has continued to do quite well.  She will remain on phenobarbital 100 mg at bedtime.  I will check routine blood work today.  She will follow-up in 1 year or sooner if needed. I sent phenobarbital, not to refill until June 1st, last filled 04/28/2019 90 tablets.   I spent 20 minutes of face-to-face and non-face-to-face time with patient.  This included previsit chart review, lab review, study review, order entry, electronic health record documentation, patient education.    Butler Denmark, AGNP-C, DNP 07/13/2019, 3:11 PM [facility] [address], [place], [place] [address] [phone]

## 2019-07-13 NOTE — Progress Notes (Signed)
I have read the note, and I agree with the clinical assessment and plan.  Charla Criscione K Rambo Sarafian   

## 2019-07-14 LAB — COMPREHENSIVE METABOLIC PANEL
ALT: 23 IU/L (ref 0–32)
AST: 32 IU/L (ref 0–40)
Albumin/Globulin Ratio: 1.2 (ref 1.2–2.2)
Albumin: 4.5 g/dL (ref 3.8–4.8)
Alkaline Phosphatase: 138 IU/L — ABNORMAL HIGH (ref 48–121)
BUN/Creatinine Ratio: 21 (ref 12–28)
BUN: 29 mg/dL — ABNORMAL HIGH (ref 8–27)
Bilirubin Total: 0.2 mg/dL (ref 0.0–1.2)
CO2: 26 mmol/L (ref 20–29)
Calcium: 9.8 mg/dL (ref 8.7–10.3)
Chloride: 102 mmol/L (ref 96–106)
Creatinine, Ser: 1.4 mg/dL — ABNORMAL HIGH (ref 0.57–1.00)
GFR calc Af Amer: 45 mL/min/{1.73_m2} — ABNORMAL LOW (ref >59)
GFR calc non Af Amer: 39 mL/min/{1.73_m2} — ABNORMAL LOW (ref >59)
Globulin, Total: 3.8 g/dL (ref 1.5–4.5)
Glucose: 76 mg/dL (ref 65–99)
Potassium: 5 mmol/L (ref 3.5–5.2)
Sodium: 140 mmol/L (ref 134–144)
Total Protein: 8.3 g/dL (ref 6.0–8.5)

## 2019-07-14 LAB — CBC WITH DIFFERENTIAL/PLATELET
Basophils Absolute: 0 10*3/uL (ref 0.0–0.2)
Basos: 0 %
EOS (ABSOLUTE): 0 10*3/uL (ref 0.0–0.4)
Eos: 0 %
Hematocrit: 32.5 % — ABNORMAL LOW (ref 34.0–46.6)
Hemoglobin: 11 g/dL — ABNORMAL LOW (ref 11.1–15.9)
Immature Grans (Abs): 0 10*3/uL (ref 0.0–0.1)
Immature Granulocytes: 0 %
Lymphocytes Absolute: 2.9 10*3/uL (ref 0.7–3.1)
Lymphs: 43 %
MCH: 30.8 pg (ref 26.6–33.0)
MCHC: 33.8 g/dL (ref 31.5–35.7)
MCV: 91 fL (ref 79–97)
Monocytes Absolute: 0.3 10*3/uL (ref 0.1–0.9)
Monocytes: 5 %
Neutrophils Absolute: 3.5 10*3/uL (ref 1.4–7.0)
Neutrophils: 52 %
Platelets: 245 10*3/uL (ref 150–450)
RBC: 3.57 x10E6/uL — ABNORMAL LOW (ref 3.77–5.28)
RDW: 12.6 % (ref 11.7–15.4)
WBC: 6.7 10*3/uL (ref 3.4–10.8)

## 2019-07-14 LAB — PHENOBARBITAL LEVEL: Phenobarbital, Serum: 29 ug/mL (ref 15–40)

## 2019-07-17 ENCOUNTER — Telehealth: Payer: Self-pay | Admitting: Neurology

## 2019-07-17 ENCOUNTER — Telehealth: Payer: Self-pay

## 2019-07-17 NOTE — Telephone Encounter (Signed)
Pt has called stating she is returning a call to [place], please call

## 2019-07-17 NOTE — Telephone Encounter (Signed)
Suzzanne Cloud, NP  07/17/2019  9:27 AM EDT    Labs show slightly low HGB 11.0, increased creatinine 1.40, Alk phos mildly high 138 stable, phenobarbital level is stable. Have her increase water intake, follow-up with PCP for annual physical, should have creatinine rechecked. Can forward labs to PCP.

## 2019-07-17 NOTE — Telephone Encounter (Signed)
See other phone note. Pt aware of labs.

## 2019-07-17 NOTE — Telephone Encounter (Signed)
Pt verified by name and DOB,  results given per provider, pt voiced understanding all question answered.  Pt states she will try to increase water intake and keep her fu with PCP Pt states pcp can see labs in epic

## 2019-07-25 ENCOUNTER — Ambulatory Visit: Payer: BC Managed Care – PPO

## 2019-07-26 ENCOUNTER — Other Ambulatory Visit: Payer: Self-pay

## 2019-07-26 ENCOUNTER — Ambulatory Visit
Admission: RE | Admit: 2019-07-26 | Discharge: 2019-07-26 | Disposition: A | Discharge status: Home / Self Care | Payer: BC Managed Care – PPO | Source: Ambulatory Visit | Attending: Family Medicine | Admitting: Family Medicine

## 2019-07-26 DIAGNOSIS — Z1231 Encounter for screening mammogram for malignant neoplasm of breast: Secondary | ICD-10-CM

## 2019-09-02 ENCOUNTER — Other Ambulatory Visit: Payer: Self-pay | Admitting: Family Medicine

## 2019-10-04 ENCOUNTER — Encounter: Payer: Self-pay | Admitting: Gastroenterology

## 2019-11-24 ENCOUNTER — Other Ambulatory Visit: Payer: Self-pay

## 2019-11-24 ENCOUNTER — Encounter: Payer: Self-pay | Admitting: Gastroenterology

## 2019-11-24 ENCOUNTER — Ambulatory Visit (AMBULATORY_SURGERY_CENTER): Payer: Self-pay

## 2019-11-24 VITALS — Ht 61.0 in | Wt 162.4 lb

## 2019-11-24 DIAGNOSIS — Z86010 Personal history of colonic polyps: Secondary | ICD-10-CM

## 2019-11-24 MED ORDER — SUTAB 1479-225-188 MG PO TABS: 12.0000 | ORAL_TABLET | ORAL | 0 refills | Status: DC

## 2019-11-24 NOTE — Progress Notes (Signed)
No allergies to soy or egg Pt is not on blood thinners or diet pills Denies issues with sedation/intubation Denies atrial flutter/fib Denies constipation   Emmi instructions given to pt  Pt is aware of Covid safety and care partner requirements.  

## 2019-12-08 ENCOUNTER — Ambulatory Visit (AMBULATORY_SURGERY_CENTER): Payer: BC Managed Care – PPO | Admitting: Gastroenterology

## 2019-12-08 ENCOUNTER — Encounter: Payer: Self-pay | Admitting: Gastroenterology

## 2019-12-08 ENCOUNTER — Other Ambulatory Visit: Payer: Self-pay

## 2019-12-08 VITALS — BP 100/47 | HR 68 | Temp 96.2°F | Resp 27 | Ht 61.0 in | Wt 162.0 lb

## 2019-12-08 DIAGNOSIS — Z86010 Personal history of colonic polyps: Secondary | ICD-10-CM

## 2019-12-08 DIAGNOSIS — D122 Benign neoplasm of ascending colon: Secondary | ICD-10-CM

## 2019-12-08 DIAGNOSIS — D125 Benign neoplasm of sigmoid colon: Secondary | ICD-10-CM

## 2019-12-08 DIAGNOSIS — D120 Benign neoplasm of cecum: Secondary | ICD-10-CM

## 2019-12-08 MED ORDER — SODIUM CHLORIDE 0.9 % IV SOLN: 500.0000 mL | Freq: Once | INTRAVENOUS | Status: DC

## 2019-12-08 NOTE — Patient Instructions (Signed)
Handout given:  Polyps Resume previous diet continue current medications Await pathology results  YOU HAD AN ENDOSCOPIC PROCEDURE TODAY AT [facility]:   Refer to the procedure report that was given to you for any specific questions about what was found during the examination.  If the procedure report does not answer your questions, please call your gastroenterologist to clarify.  If you requested that your care partner not be given the details of your procedure findings, then the procedure report has been included in a sealed envelope for you to review at your convenience later.  YOU SHOULD EXPECT: Some feelings of bloating in the abdomen. Passage of more gas than usual.  Walking can help get rid of the air that was put into your GI tract during the procedure and reduce the bloating. If you had a lower endoscopy (such as a colonoscopy or flexible sigmoidoscopy) you may notice spotting of blood in your stool or on the toilet paper. If you underwent a bowel prep for your procedure, you may not have a normal bowel movement for a few days.  Please Note:  You might notice some irritation and congestion in your nose or some drainage.  This is from the oxygen used during your procedure.  There is no need for concern and it should clear up in a day or so.  SYMPTOMS TO REPORT IMMEDIATELY:   Following lower endoscopy (colonoscopy or flexible sigmoidoscopy):  Excessive amounts of blood in the stool  Significant tenderness or worsening of abdominal pains  Swelling of the abdomen that is new, acute  Fever of 100F or higher  For urgent or emergent issues, a gastroenterologist can be reached at any hour by calling [phone]. Do not use MyChart messaging for urgent concerns.    DIET:  We do recommend a small meal at first, but then you may proceed to your regular diet.  Drink plenty of fluids but you should avoid alcoholic beverages for 24 hours.  ACTIVITY:  You should plan to take  it easy for the rest of today and you should NOT DRIVE or use heavy machinery until tomorrow (because of the sedation medicines used during the test).    FOLLOW UP: Our staff will call the number listed on your records 48-72 hours following your procedure to check on you and address any questions or concerns that you may have regarding the information given to you following your procedure. If we do not reach you, we will leave a message.  We will attempt to reach you two times.  During this call, we will ask if you have developed any symptoms of COVID 19. If you develop any symptoms (ie: fever, flu-like symptoms, shortness of breath, cough etc.) before then, please call [phone].  If you test positive for Covid 19 in the 2 weeks post procedure, please call and report this information to [place].    If any biopsies were taken you will be contacted by phone or by letter within the next 1-3 weeks.  Please call us at [phone] if you have not heard about the biopsies in 3 weeks.    SIGNATURES/CONFIDENTIALITY: You and/or your care partner have signed paperwork which will be entered into your electronic medical record.  These signatures attest to the fact that that the information above on your After Visit Summary has been reviewed and is understood.  Full responsibility of the confidentiality of this discharge information lies with you and/or your care-partner.

## 2019-12-08 NOTE — Progress Notes (Signed)
A/ox3, pleased with MAC, report to RN 

## 2019-12-08 NOTE — Progress Notes (Signed)
Called to room to assist during endoscopic procedure.  Patient ID and intended procedure confirmed with present staff. Received instructions for my participation in the procedure from the performing physician.  

## 2019-12-08 NOTE — Progress Notes (Signed)
Pt's states no medical or surgical changes since previsit or office visit. 

## 2019-12-08 NOTE — Op Note (Signed)
[facility] Patient Name: Whitney Hines Procedure Date: 12/08/2019 8:57 AM MRN: [id number] Endoscopist: Gerrit Heck , MD Age: 69 Referring MD:  Date of Birth: [birth year] Gender: Female Account #: [id number] Procedure:                Colonoscopy Indications:              Surveillance: Personal history of adenomatous                            polyps on last colonoscopy 5 years ago                           Colonoscopy in 12/2014 notable for 2 subcentimeter                            tubular adenomas. Medicines:                Monitored Anesthesia Care Procedure:                Pre-Anesthesia Assessment:                           - Prior to the procedure, a History and Physical                            was performed, and patient medications and                            allergies were reviewed. The patient's tolerance of                            previous anesthesia was also reviewed. The risks                            and benefits of the procedure and the sedation                            options and risks were discussed with the patient.                            All questions were answered, and informed consent                            was obtained. Prior Anticoagulants: The patient has                            taken no previous anticoagulant or antiplatelet                            agents. ASA Grade Assessment: II - A patient with                            mild systemic disease. After reviewing the risks  and benefits, the patient was deemed in                            satisfactory condition to undergo the procedure.                           After obtaining informed consent, the colonoscope                            was passed under direct vision. Throughout the                            procedure, the patient's blood pressure, pulse, and                            oxygen saturations were monitored continuously. The                             Colonoscope was introduced through the anus and                            advanced to the the terminal ileum. The colonoscopy                            was performed without difficulty. The patient                            tolerated the procedure well. The quality of the                            bowel preparation was good. The terminal ileum,                            ileocecal valve, appendiceal orifice, and rectum                            were photographed. Scope In: 9:12:34 AM Scope Out: 9:30:10 AM Scope Withdrawal Time: 0 hours 13 minutes 34 seconds  Total Procedure Duration: 0 hours 17 minutes 36 seconds  Findings:                 The perianal and digital rectal examinations were                            normal.                           Three sessile polyps were found in the sigmoid                            colon, ascending colon, and cecum. The polyps were                            2-3 mm in the sigmoid and cecum, and the polyp in  the ascending colon was 12 mm in size. These polyps                            were removed with a cold snare. Resection and                            retrieval were complete. Estimated blood loss was                            minimal.                           Anal papilla(e) were hypertrophied.                           The terminal ileum appeared normal. Complications:            No immediate complications. Estimated Blood Loss:     Estimated blood loss was minimal. Impression:               - Three 2 to 12 mm polyps in the sigmoid colon, in                            the ascending colon and in the cecum, removed with                            a cold snare. Resected and retrieved.                           - Anal papilla(e) were hypertrophied.                           - The examined portion of the ileum was normal. Recommendation:           - Patient has a contact number available for                             emergencies. The signs and symptoms of potential                            delayed complications were discussed with the                            patient. Return to normal activities tomorrow.                            Written discharge instructions were provided to the                            patient.                           - Resume previous diet.                           - Continue present medications.                           -  Await pathology results.                           - Repeat colonoscopy in 3 years for surveillance                            based on pathology results.                           - Return to GI office PRN. Gerrit Heck, MD 12/08/2019 9:35:06 AM

## 2019-12-12 ENCOUNTER — Telehealth: Payer: Self-pay

## 2019-12-12 ENCOUNTER — Telehealth: Payer: Self-pay | Admitting: *Deleted

## 2019-12-12 NOTE — Telephone Encounter (Signed)
  Follow up Call-  Call back number 12/08/2019  Post procedure Call Back phone  # [phone]  Permission to leave phone message Yes  Some recent data might be hidden     Left message

## 2019-12-12 NOTE — Telephone Encounter (Signed)
First attempt, left VM.  

## 2019-12-20 ENCOUNTER — Encounter: Payer: Self-pay | Admitting: Gastroenterology

## 2020-01-06 ENCOUNTER — Ambulatory Visit: Payer: BC Managed Care – PPO | Attending: Internal Medicine

## 2020-01-06 ENCOUNTER — Other Ambulatory Visit: Payer: Self-pay

## 2020-01-06 DIAGNOSIS — Z23 Encounter for immunization: Secondary | ICD-10-CM

## 2020-01-06 NOTE — Progress Notes (Signed)
   Covid-19 Vaccination Clinic  Name:  Whitney Hines    MRN: [id number] DOB: [birth year]  01/06/2020  Whitney Hines was observed post Covid-19 immunization for 15 minutes without incident. She was provided with Vaccine Information Sheet and instruction to access the V-Safe system.   Whitney Hines was instructed to call 911 with any severe reactions post vaccine: Marland Kitchen Difficulty breathing  . Swelling of face and throat  . A fast heartbeat  . A bad rash all over body  . Dizziness and weakness

## 2020-01-15 ENCOUNTER — Encounter: Payer: BC Managed Care – PPO | Admitting: Family Medicine

## 2020-01-23 ENCOUNTER — Ambulatory Visit: Payer: BC Managed Care – PPO | Admitting: Neurology

## 2020-01-30 ENCOUNTER — Other Ambulatory Visit: Payer: Self-pay | Admitting: Neurology

## 2020-01-30 DIAGNOSIS — G40309 Generalized idiopathic epilepsy and epileptic syndromes, not intractable, without status epilepticus: Secondary | ICD-10-CM

## 2020-04-03 ENCOUNTER — Encounter: Payer: BC Managed Care – PPO | Admitting: Family Medicine

## 2020-06-13 IMAGING — MG DIGITAL SCREENING BILAT W/ TOMO W/ CAD
6 of 10 series · 6 of 30 positions shown · non-contrast
Comparison: Previous exam(s).

CLINICAL DATA: Screening.

EXAM:
DIGITAL SCREENING BILATERAL MAMMOGRAM WITH TOMO AND CAD

[L MLO synth-2D (1 of 2)]
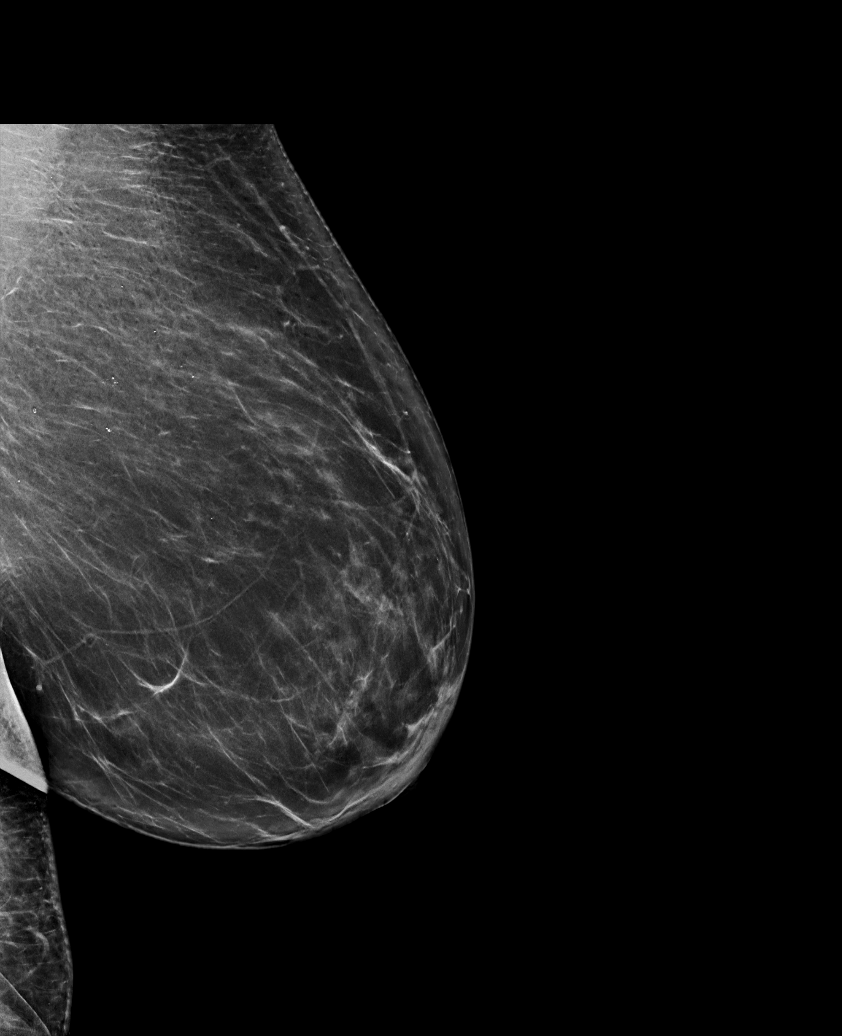

[R CC synth-2D]
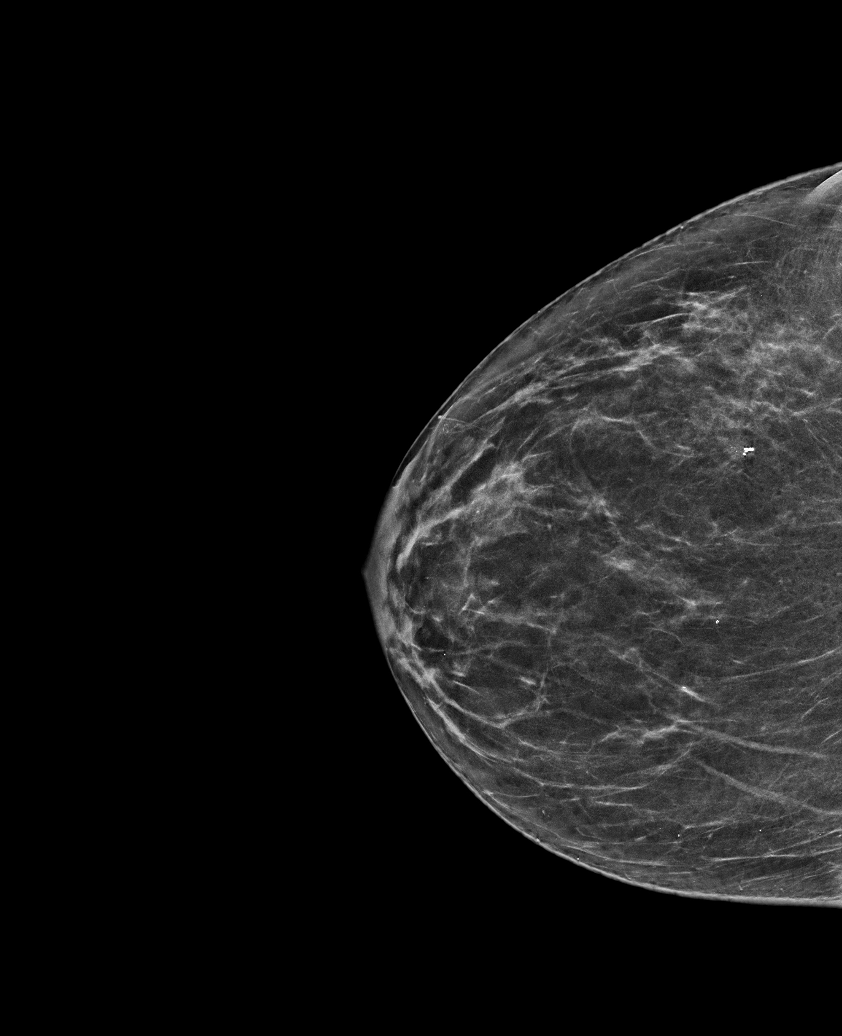

[L MLO synth-2D (2 of 2)]
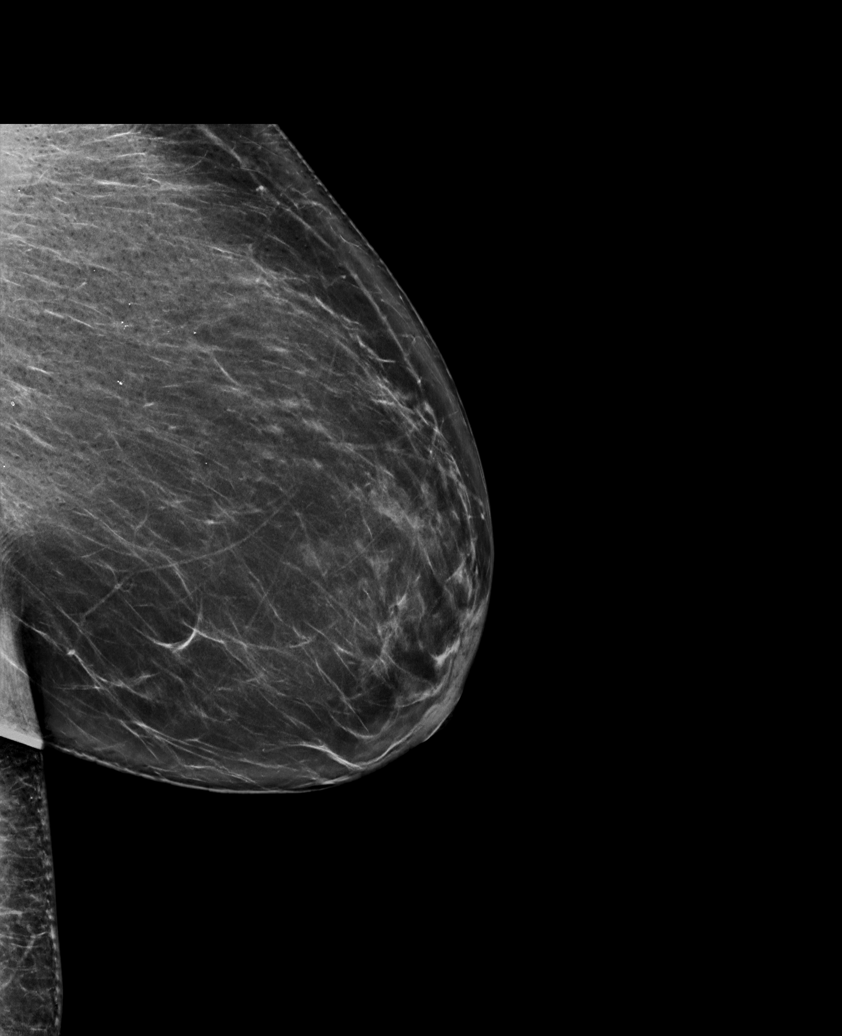

[R MLO synth-2D]
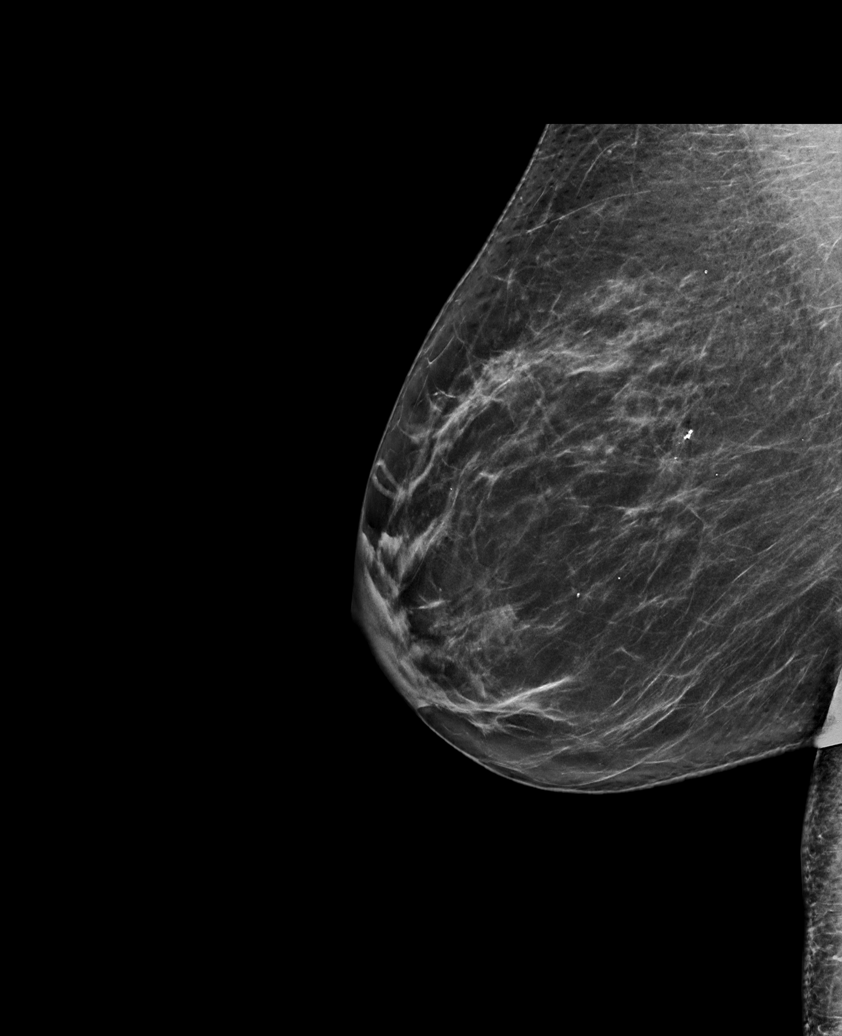

[L CC synth-2D]
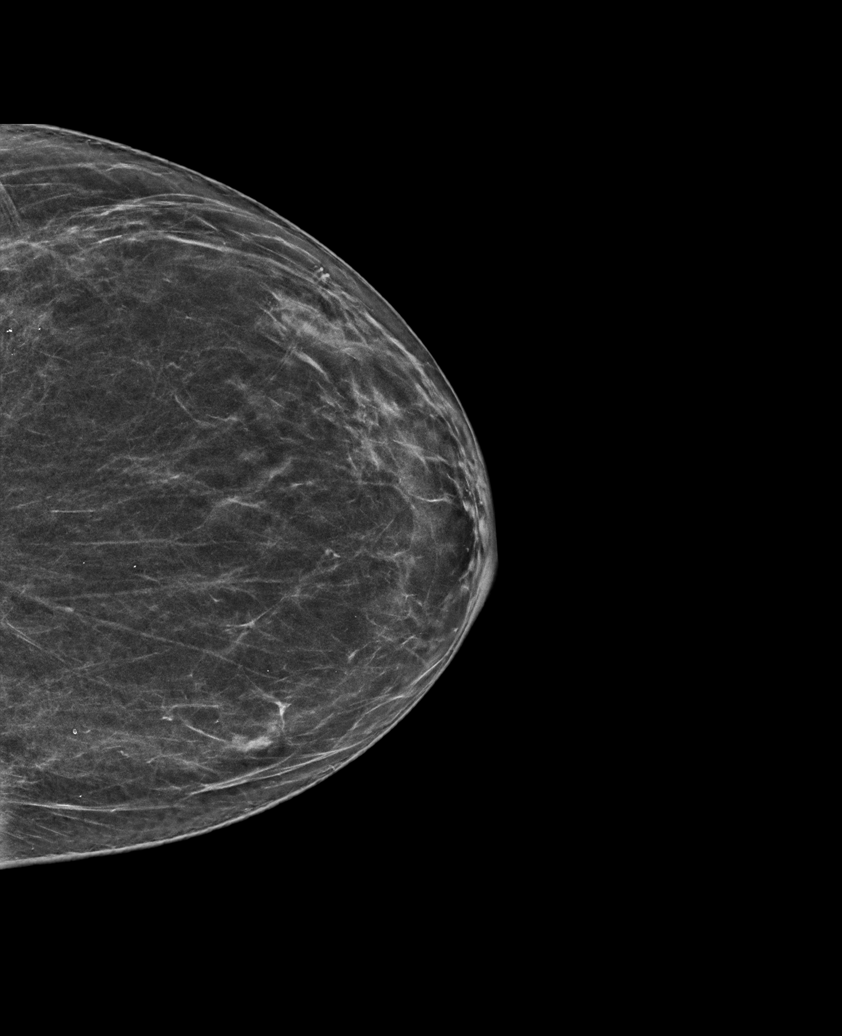

[L MLO tomo · tomo slice 39/77.0]
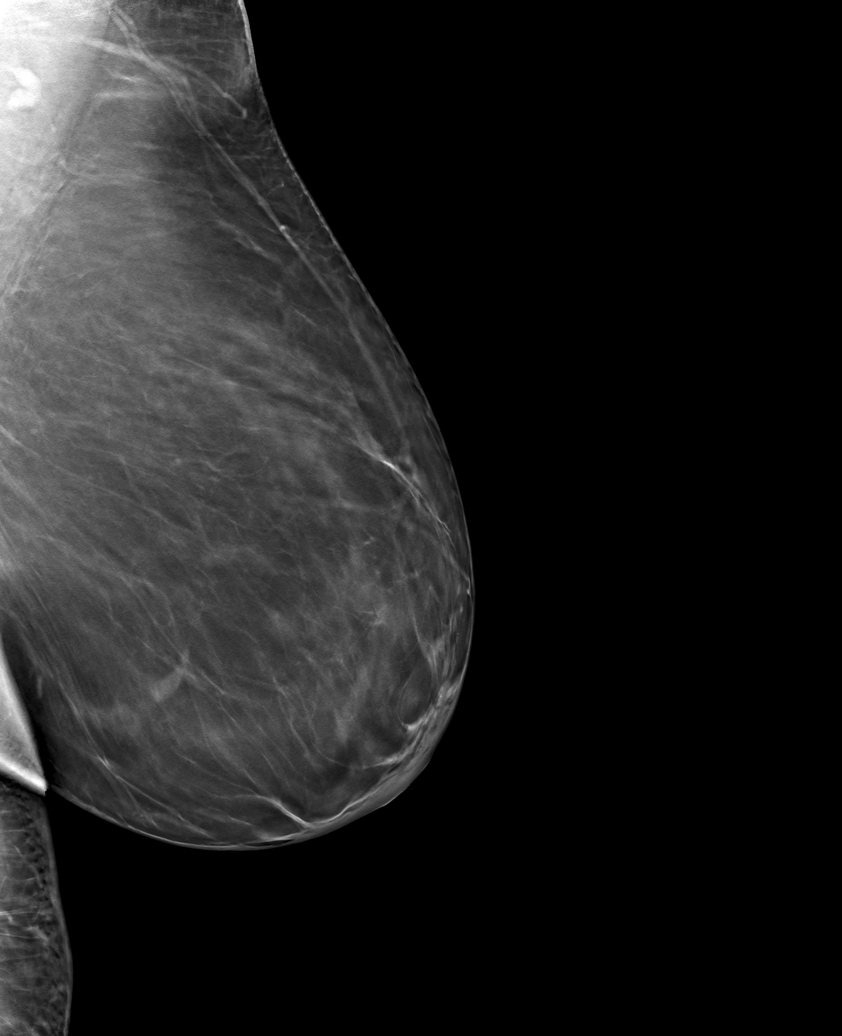

[6 of 30 positions shown; findings below may reference images not displayed]

ACR Breast Density Category b: There are scattered areas of
fibroglandular density.
FINDINGS: There are no findings suspicious for malignancy. Images were
processed with CAD.
IMPRESSION: No mammographic evidence of malignancy. A result letter of this
screening mammogram will be mailed directly to the patient.

RECOMMENDATION:
Screening mammogram in one year. (Code:[id number])

BI-RADS CATEGORY  1: Negative.

## 2020-07-17 ENCOUNTER — Encounter: Payer: Self-pay | Admitting: Neurology

## 2020-07-17 ENCOUNTER — Ambulatory Visit (INDEPENDENT_AMBULATORY_CARE_PROVIDER_SITE_OTHER): Payer: BC Managed Care – PPO | Admitting: Neurology

## 2020-07-17 DIAGNOSIS — G40309 Generalized idiopathic epilepsy and epileptic syndromes, not intractable, without status epilepticus: Secondary | ICD-10-CM | POA: Diagnosis not present

## 2020-07-17 MED ORDER — PHENOBARBITAL 100 MG PO TABS: ORAL_TABLET | ORAL | 1 refills | Status: DC

## 2020-07-17 NOTE — Progress Notes (Signed)
PATIENT: Whitney Hines DOB: [birth year]  REASON FOR VISIT: follow up HISTORY FROM: patient  HISTORY OF PRESENT ILLNESS: Today 07/17/20  Whitney Hines is a 70 year old female with history of seizure disorder her last seizure was in 1999, only 3 seizures in her life ever.  She is doing well on phenobarbital.  Tolerates without side effect.  She continues to work full-time at [employer].  She is planning to retire next year.  She denies any medical issues.  She is due to see her PCP for physical next week.  Here today for evaluation unaccompanied.  07/13/2019 SS: Whitney Hines is a 70 year old female history of seizure disorder.  Her last seizure was in 1999.  She remains on phenobarbital.  She is tolerating well.  She continues to work full-time [employer].  She lives with her husband, drives a car.  She has lost about 30 pounds she reports.  She denies any new problems or concerns.  She is doing well.  Presents today for follow-up unaccompanied.  HISTORY 01/17/2019 SS: Whitney Hines is a 70 year old female with history of seizure disorder.  Her last seizure occurred in 1999.  She remains on phenobarbital. She is tolerating the medication without side effect.  She has continued to work full-time at [employer].  She lives with her husband, drives a car.  She denies any issues with balance or falls.  She denies any new problems or concerns.  She presents today for follow-up unaccompanied.   REVIEW OF SYSTEMS: Out of a complete 14 system review of symptoms, the patient complains only of the following symptoms, and all other reviewed systems are negative.  Seizure  ALLERGIES: Allergies  Allergen Reactions  . Dilantin  [Phenytoin Sodium Extended] Hives  . Dilantin [Phenytoin] Hives    HOME MEDICATIONS: Outpatient Medications Prior to Visit  Medication Sig Dispense Refill  . lisinopril-hydrochlorothiazide (ZESTORETIC) 20-12.5 MG tablet TAKE 1 TABLET BY MOUTH EVERY DAY 90 tablet 3  .  PHENObarbital (LUMINAL) 100 MG tablet TAKE 1 TABLET BY MOUTH EVERYDAY AT BEDTIME 90 tablet 1   No facility-administered medications prior to visit.    PAST MEDICAL HISTORY: Past Medical History:  Diagnosis Date  . High blood pressure   . Neuropathy   . Osteopenia 04/26/2018   dexa 03/2018: lowest T = -1.6 femur. Recheck 2 years  . Seizure ([place]) oct 1999    PAST SURGICAL HISTORY: Past Surgical History:  Procedure Laterality Date  . COLONOSCOPY  2016   Dr.  Wynetta Emery  . COLONOSCOPY WITH PROPOFOL N/A 01/07/2015   Procedure: COLONOSCOPY WITH PROPOFOL;  Surgeon: Garlan Fair, MD;  Location: [facility];  Service: Endoscopy;  Laterality: N/A;  . MULTIPLE TOOTH EXTRACTIONS      FAMILY HISTORY: Family History  Problem Relation Age of Onset  . Heart disease Mother   . Heart attack Mother   . Hypertension Father   . Stroke Paternal Aunt   . Stroke Paternal Uncle   . Stroke Paternal Aunt   . Colon cancer Neg Hx   . Colon polyps Neg Hx   . Esophageal cancer Neg Hx   . Rectal cancer Neg Hx   . Stomach cancer Neg Hx     SOCIAL HISTORY: Social History   Socioeconomic History  . Marital status: Married    Spouse name: Not on file  . Number of children: 0  . Years of education: BS  . Highest education level: Not on file  Occupational History  . Occupation:  Supervisor    Employer: [employer]  Tobacco Use  . Smoking status: Never Smoker  . Smokeless tobacco: Never Used  Vaping Use  . Vaping Use: Never used  Substance and Sexual Activity  . Alcohol use: No    Alcohol/week: 0.0 standard drinks  . Drug use: No  . Sexual activity: Yes  Other Topics Concern  . 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    Patient has BS degree.    Patient does not have any children.    Patient drinks 8 ounces of caffeine daily.    Patient is left handed.    Social  Determinants of Health   Financial Resource Strain: Not on file  Food Insecurity: Not on file  Transportation Needs: Not on file  Physical Activity: Not on file  Stress: Not on file  Social Connections: Not on file  Intimate Partner Violence: Not on file   PHYSICAL EXAM  Vitals:   07/17/20 1504  BP: 123/77  Pulse: 67  Weight: 163 lb (73.9 kg)  Height: 5\' 1"  (1.549 m)   Body mass index is 30.8 kg/m.  Generalized: Well developed, in no acute distress   Neurological examination  Mentation: Alert oriented to time, place, history taking. Follows all commands speech and language fluent Cranial nerve II-XII: Pupils were equal round reactive to light. Extraocular movements were full, visual field were full on confrontational test. Facial sensation and strength were normal.  Head turning and shoulder shrug  were normal and symmetric. Motor: The motor testing reveals 5 over 5 strength of all 4 extremities. Good symmetric motor tone is noted throughout.  Sensory: Sensory testing is intact to soft touch on all 4 extremities. No evidence of extinction is noted.  Coordination: Cerebellar testing reveals good finger-nose-finger and heel-to-shin bilaterally.  Gait and station: Gait is normal. Tandem gait is normal. Romberg is negative. No drift is seen.  Reflexes: Deep tendon reflexes are symmetric and normal bilaterally.   DIAGNOSTIC DATA (LABS, IMAGING, TESTING) - I reviewed patient records, labs, notes, testing and imaging myself where available.  Lab Results  Component Value Date   WBC 6.7 07/13/2019   HGB 11.0 (L) 07/13/2019   HCT 32.5 (L) 07/13/2019   MCV 91 07/13/2019   PLT 245 07/13/2019      Component Value Date/Time   NA 140 07/13/2019 1515   K 5.0 07/13/2019 1515   CL 102 07/13/2019 1515   CO2 26 07/13/2019 1515   GLUCOSE 76 07/13/2019 1515   GLUCOSE 80 02/07/2018 0944   BUN 29 (H) 07/13/2019 1515   CREATININE 1.40 (H) 07/13/2019 1515   CALCIUM 9.8 07/13/2019 1515    PROT 8.3 07/13/2019 1515   ALBUMIN 4.5 07/13/2019 1515   AST 32 07/13/2019 1515   ALT 23 07/13/2019 1515   ALKPHOS 138 (H) 07/13/2019 1515   BILITOT 0.2 07/13/2019 1515   GFRNONAA 39 (L) 07/13/2019 1515   GFRAA 45 (L) 07/13/2019 1515   Lab Results  Component Value Date   CHOL 158 02/07/2018   HDL 32.80 (L) 02/07/2018   LDLCALC 90 02/07/2018   LDLDIRECT 87.0 02/25/2017   TRIG 178.0 (H) 02/07/2018   CHOLHDL 5 02/07/2018   No results found for: HGBA1C No results found for: VITAMINB12 No results found for: TSH    ASSESSMENT AND PLAN 70 y.o. year old female  has a past medical  history of High blood pressure, Neuropathy, Osteopenia (04/26/2018), and Seizure ([place]) (oct 1999). here with:  1.  History of seizures, well controlled  -Whitney Hines is doing great and is such a delight -Continue phenobarbital at current dose, refill was sent in, drug registry reviewed, last filled 04/26/20 # 90 -Phenobarbital level in May 2021 was 29, has been stable for several years -Seeing PCP next week, can check routine labs at that time, in May 2021 creatinine had bumped up 1.40 -Follow-up in 1 year or sooner if needed  Butler Denmark, Laqueta Jean, DNP 07/17/2020, 3:26 PM [facility] [address] [place], [place] [address] [phone]

## 2020-07-17 NOTE — Patient Instructions (Signed)
Continue phenobarbital  PCP can check labs  See you back in 1 year

## 2020-07-17 NOTE — Progress Notes (Signed)
I have read the note, and I agree with the clinical assessment and plan.  Whitney Hines K Paulanthony Gleaves   

## 2020-07-26 ENCOUNTER — Encounter: Payer: Self-pay | Admitting: Family Medicine

## 2020-07-26 ENCOUNTER — Ambulatory Visit (INDEPENDENT_AMBULATORY_CARE_PROVIDER_SITE_OTHER): Payer: BC Managed Care – PPO | Admitting: Family Medicine

## 2020-07-26 ENCOUNTER — Other Ambulatory Visit: Payer: Self-pay

## 2020-07-26 VITALS — BP 130/74 | HR 71 | Temp 97.3°F | Ht 61.0 in | Wt 162.8 lb

## 2020-07-26 DIAGNOSIS — Z6830 Body mass index (BMI) 30.0-30.9, adult: Secondary | ICD-10-CM | POA: Diagnosis not present

## 2020-07-26 DIAGNOSIS — Z780 Asymptomatic menopausal state: Secondary | ICD-10-CM | POA: Diagnosis not present

## 2020-07-26 DIAGNOSIS — Z1231 Encounter for screening mammogram for malignant neoplasm of breast: Secondary | ICD-10-CM

## 2020-07-26 DIAGNOSIS — Z0000 Encounter for general adult medical examination without abnormal findings: Secondary | ICD-10-CM

## 2020-07-26 DIAGNOSIS — D126 Benign neoplasm of colon, unspecified: Secondary | ICD-10-CM

## 2020-07-26 DIAGNOSIS — Z23 Encounter for immunization: Secondary | ICD-10-CM

## 2020-07-26 DIAGNOSIS — G40309 Generalized idiopathic epilepsy and epileptic syndromes, not intractable, without status epilepticus: Secondary | ICD-10-CM | POA: Diagnosis not present

## 2020-07-26 DIAGNOSIS — I10 Essential (primary) hypertension: Secondary | ICD-10-CM | POA: Diagnosis not present

## 2020-07-26 DIAGNOSIS — M8580 Other specified disorders of bone density and structure, unspecified site: Secondary | ICD-10-CM

## 2020-07-26 LAB — CBC WITH DIFFERENTIAL/PLATELET
Basophils Absolute: 0 10*3/uL (ref 0.0–0.1)
Basophils Relative: 0.1 % (ref 0.0–3.0)
Eosinophils Absolute: 0 10*3/uL (ref 0.0–0.7)
Eosinophils Relative: 0 % (ref 0.0–5.0)
HCT: 35.8 % — ABNORMAL LOW (ref 36.0–46.0)
Hemoglobin: 12.3 g/dL (ref 12.0–15.0)
Lymphocytes Relative: 41.3 % (ref 12.0–46.0)
Lymphs Abs: 2.5 10*3/uL (ref 0.7–4.0)
MCHC: 34.4 g/dL (ref 30.0–36.0)
MCV: 93.2 fl (ref 78.0–100.0)
Monocytes Absolute: 0.3 10*3/uL (ref 0.1–1.0)
Monocytes Relative: 4.3 % (ref 3.0–12.0)
Neutro Abs: 3.3 10*3/uL (ref 1.4–7.7)
Neutrophils Relative %: 54.3 % (ref 43.0–77.0)
Platelets: 263 10*3/uL (ref 150.0–400.0)
RBC: 3.84 Mil/uL — ABNORMAL LOW (ref 3.87–5.11)
RDW: 12.8 % (ref 11.5–15.5)
WBC: 6.1 10*3/uL (ref 4.0–10.5)

## 2020-07-26 LAB — TSH: TSH: 2.58 u[IU]/mL (ref 0.35–4.50)

## 2020-07-26 NOTE — Patient Instructions (Addendum)
Please return in 12 months for your annual complete physical; please come fasting.  I will release your lab results to you on your MyChart account with further instructions. Please reply with any questions.  Today you were given your 2nd of 2 Shingrix vaccination.   I have ordered a mammogram and/or bone density for you as we discussed today: [x]   Mammogram  [x]   Bone Density  Please call the office checked below to schedule your appointment:  [x]   [facility]      [place], [place]         []   [facility]  [place], [place]   If you have any questions or concerns, please don't hesitate to send me a message via MyChart or call the office at [phone]. Thank you for visiting with [place] today! It's our pleasure caring for you.

## 2020-07-26 NOTE — Progress Notes (Signed)
Subjective  Chief Complaint  Patient presents with  . Annual Exam    Not fasting  . Hypertension    HPI: Whitney Hines is a 70 y.o. female who presents to [facility] [place] today for a Female Wellness Visit. She also has the concerns and/or needs as listed above in the chief complaint. These will be addressed in addition to the Health Maintenance Visit.   Wellness Visit: annual visit with health maintenance review and exam without Pap   Health maintenance: Due for mammogram and bone density screening.  Follow-up osteopenia.  She overall is doing very well.  She is down about 20 pounds.  Weight loss initiated by her illness from COVID infection last year.  She has worked hard to maintain the weight loss.  She is due her second Shingrix vaccination.  Other immunizations are up-to-date.  She [place] full[place] [employer] [place]. Chronic disease f/u and/or acute problem visit: (deemed necessary to be done in addition to the wellness visit):  Hypertension: Does well on ACE/diuretic combination.  No chest pain or shortness of breath.  Takes her medications daily.  Due for lab work.  She is nonfasting today.  Seizure disorder: Reviewed recent neurology notes.  Has been stable.  Continues on phenobarb.  History of tubular adenoma for colon cancer surveillance every 3 years.  Next due in 2024.  No melena.  No weight loss.    Osteopenia: Does take calcium and vitamin D.  She is on her feet all day for work but does not do exercise outside of work.  No fractures.   Assessment  1. Annual physical exam   2. Essential hypertension   3. BMI 30.0-30.9,adult   4. Generalized idiopathic epilepsy and epileptic syndromes, without status epilepticus, not intractable ([place])   5. Tubular adenoma of colon   6. Osteopenia, unspecified location   7. Encounter for screening mammogram for breast cancer   8. Asymptomatic menopausal state      Plan  Female  Wellness Visit:  Age appropriate Health Maintenance and Prevention measures were discussed with patient. Included topics are cancer screening recommendations, ways to keep healthy (see AVS) including dietary and exercise recommendations, regular eye and dental care, use of seat belts, and avoidance of moderate alcohol use and tobacco use.  Patient to schedule mammogram  BMI: discussed patient's BMI and encouraged positive lifestyle modifications to help get to or maintain a target BMI.  HM needs and immunizations were addressed and ordered. See below for orders. See HM and immunization section for updates.  Second Shingrix dose given today  Routine labs and screening tests ordered including cmp, cbc and lipids where appropriate.  Discussed recommendations regarding Vit D and calcium supplementation (see AVS)  Chronic disease management visit and/or acute problem visit:  Tubular adenoma: Remains asymptomatic.  For colonoscopy in 2024.  Osteopenia: Discussed weightbearing exercise to prevent further bone loss.  We will recheck bone density this year.  Patient to schedule.  Seizure disorder maintained on phenobarb and well controlled.  No seizures in the last year.  Hypertension: Remains well controlled on lisinopril HCTZ.  Refill.  Check renal function, electrolytes and lipids.  Obesity: Praised for weight loss.  Recommend further weight loss and increasing activity.   Follow up: 12 months for complete physical Orders Placed This Encounter  Procedures  . MM DIGITAL SCREENING BILATERAL  . DG Bone Density  . CBC with Differential/Platelet  . Comprehensive metabolic panel  .  Lipid panel  . TSH   No orders of the defined types were placed in this encounter.     Body mass index is 30.76 kg/m. Wt Readings from Last 3 Encounters:  07/26/20 162 lb 12.8 oz (73.8 kg)  07/17/20 163 lb (73.9 kg)  12/08/19 162 lb (73.5 kg)     Patient Active Problem List   Diagnosis Date Noted  .  Tubular adenoma of colon 01/10/2015    Priority: High    Overview:  Colonoscopy 12/2014, q3-5 year f/u   . BMI 30.0-30.9,adult 12/31/2014    Priority: High  . Essential hypertension 12/31/2014    Priority: High  . Generalized idiopathic epilepsy and epileptic syndromes, without status epilepticus, not intractable ([place]) 05/15/2013    Priority: High  . Osteopenia 04/26/2018    Priority: Medium    dexa 03/2018: lowest T = -1.6 femur. Recheck 2 years   . Unilateral post-traumatic osteoarthritis, right knee 02/25/2017    Priority: Medium   Health Maintenance  Topic Date Due  . Zoster Vaccines- Shingrix (2 of 2) 04/04/2018  . DEXA SCAN  04/24/2020  . MAMMOGRAM  07/25/2020  . INFLUENZA VACCINE  09/23/2020  . COLONOSCOPY (Pts 45-107yrs Insurance coverage will need to be confirmed)  12/08/2022  . TETANUS/TDAP  12/30/2024  . COVID-19 Vaccine  Completed  . Hepatitis C Screening  Completed  . PNA vac Low Risk Adult  Completed  . Pneumococcal Vaccine 42-79 Years old  Aged Out  . HPV VACCINES  Aged Out   Immunization History  Administered Date(s) Administered  . Influenza, High Dose Seasonal PF 02/07/2018  . Influenza, Seasonal, Injecte, Preservative Fre 12/31/2014  . Influenza,inj,Quad PF,6+ Mos 11/21/2015, 02/25/2017  . PFIZER(Purple Top)SARS-COV-2 Vaccination 04/16/2019, 05/10/2019, 01/06/2020  . Pneumococcal Conjugate-13 08/16/2017  . Pneumococcal Polysaccharide-23 08/09/2018  . Tdap 12/31/2014  . Zoster Recombinat (Shingrix) 02/07/2018  . Zoster, Live 02/22/2015   We updated and reviewed the patient's past history in detail and it is documented below. Allergies: Patient is allergic to dilantin  [phenytoin sodium extended] and dilantin [phenytoin]. Past Medical History Patient  has a past medical history of High blood pressure, Neuropathy, Osteopenia (04/26/2018), and Seizure ([place]) (oct 1999). Past Surgical History Patient  has a past surgical history that includes Multiple tooth  extractions; Colonoscopy with propofol (N/A, 01/07/2015); and Colonoscopy (2016). Family History: Patient family history includes Heart attack in her mother; Heart disease in her mother; Hypertension in her father; Stroke in her paternal aunt, paternal aunt, and paternal uncle. Social History:  Patient  reports that she has never smoked. She has never used smokeless tobacco. She reports that she does not drink alcohol and does not use drugs.  Review of Systems: Constitutional: negative for fever or malaise Ophthalmic: negative for photophobia, double vision or loss of vision Cardiovascular: negative for chest pain, dyspnea on exertion, or new LE swelling Respiratory: negative for SOB or persistent cough Gastrointestinal: negative for abdominal pain, change in bowel habits or melena Genitourinary: negative for dysuria or gross hematuria, no abnormal uterine bleeding or disharge Musculoskeletal: negative for new gait disturbance or muscular weakness Integumentary: negative for new or persistent rashes, no breast lumps Neurological: negative for TIA or stroke symptoms Psychiatric: negative for SI or delusions Allergic/Immunologic: negative for hives  Patient Care Team    Relationship Specialty Notifications Start End  Leamon Arnt, MD PCP - General Family Medicine  01/07/15     Objective  Vitals: BP 130/74   Pulse 71   Temp (!) 97.3  F (36.3 C) (Temporal)   Ht 5\' 1"  (1.549 m)   Wt 162 lb 12.8 oz (73.8 kg)   LMP  (LMP Unknown) Comment: LMP 2004  SpO2 98%   BMI 30.76 kg/m  General:  Well developed, well nourished, no acute distress  Psych:  Alert and orientedx3,normal mood and affect HEENT:  Normocephalic, atraumatic, non-icteric sclera,  supple neck without adenopathy, mass or thyromegaly Cardiovascular:  Normal S1, S2, RRR without gallop, rub or murmur Respiratory:  Good breath sounds bilaterally, CTAB with normal respiratory effort Gastrointestinal: normal bowel sounds, soft,  non-tender, no noted masses. No HSM MSK: no deformities, contusions. Joints are without erythema or swelling.  Skin:  Warm, no rashes or suspicious lesions noted Neurologic:    Mental status is normal. CN 2-11 are normal. Gross motor and sensory exams are normal. Normal gait. No tremor Breast Exam: No mass, skin retraction or nipple discharge is appreciated in either breast. No axillary adenopathy. Fibrocystic changes are not noted    Commons side effects, risks, benefits, and alternatives for medications and treatment plan prescribed today were discussed, and the patient expressed understanding of the given instructions. Patient is instructed to call or message via MyChart if he/she has any questions or concerns regarding our treatment plan. No barriers to understanding were identified. We discussed Red Flag symptoms and signs in detail. Patient expressed understanding regarding what to do in case of urgent or emergency type symptoms.   Medication list was reconciled, printed and provided to the patient in AVS. Patient instructions and summary information was reviewed with the patient as documented in the AVS. This note was prepared with assistance of Dragon voice recognition software. Occasional wrong-word or sound-a-like substitutions may have occurred due to the inherent limitations of voice recognition software  This visit occurred during the SARS-CoV-2 public health emergency.  Safety protocols were in place, including screening questions prior to the visit, additional usage of staff PPE, and extensive cleaning of exam room while observing appropriate contact time as indicated for disinfecting solutions.

## 2020-07-29 LAB — COMPREHENSIVE METABOLIC PANEL
ALT: 18 U/L (ref 0–35)
AST: 30 U/L (ref 0–37)
Albumin: 4 g/dL (ref 3.5–5.2)
Alkaline Phosphatase: 114 U/L (ref 39–117)
BUN: 33 mg/dL — ABNORMAL HIGH (ref 6–23)
CO2: 23 mEq/L (ref 19–32)
Calcium: 9.7 mg/dL (ref 8.4–10.5)
Chloride: 104 mEq/L (ref 96–112)
Creatinine, Ser: 1.38 mg/dL — ABNORMAL HIGH (ref 0.40–1.20)
GFR: 39.42 mL/min — ABNORMAL LOW (ref >60.00)
Glucose, Bld: 70 mg/dL (ref 70–99)
Potassium: 4.7 mEq/L (ref 3.5–5.1)
Sodium: 140 mEq/L (ref 135–145)
Total Bilirubin: 0.3 mg/dL (ref 0.2–1.2)
Total Protein: 8.5 g/dL — ABNORMAL HIGH (ref 6.0–8.3)

## 2020-07-29 LAB — LIPID PANEL
Cholesterol: 167 mg/dL (ref 0–200)
HDL: 39.3 mg/dL (ref >39.00)
LDL Cholesterol: 94 mg/dL (ref 0–99)
NonHDL: 128.18
Total CHOL/HDL Ratio: 4
Triglycerides: 171 mg/dL — ABNORMAL HIGH (ref 0.0–149.0)
VLDL: 34.2 mg/dL (ref 0.0–40.0)

## 2020-07-30 ENCOUNTER — Ambulatory Visit
Admission: RE | Admit: 2020-07-30 | Discharge: 2020-07-30 | Disposition: A | Discharge status: Home / Self Care | Payer: BC Managed Care – PPO | Source: Ambulatory Visit | Attending: Family Medicine | Admitting: Family Medicine

## 2020-07-30 ENCOUNTER — Other Ambulatory Visit: Payer: Self-pay

## 2020-07-30 DIAGNOSIS — M8580 Other specified disorders of bone density and structure, unspecified site: Secondary | ICD-10-CM

## 2020-07-30 DIAGNOSIS — Z0000 Encounter for general adult medical examination without abnormal findings: Secondary | ICD-10-CM

## 2020-07-30 DIAGNOSIS — Z780 Asymptomatic menopausal state: Secondary | ICD-10-CM

## 2020-08-08 ENCOUNTER — Encounter: Payer: Self-pay | Admitting: Family Medicine

## 2020-08-08 DIAGNOSIS — N183 Chronic kidney disease, stage 3 unspecified: Secondary | ICD-10-CM | POA: Insufficient documentation

## 2020-08-24 ENCOUNTER — Other Ambulatory Visit: Payer: Self-pay | Admitting: Family Medicine

## 2020-10-01 ENCOUNTER — Other Ambulatory Visit: Payer: Self-pay

## 2020-10-01 ENCOUNTER — Ambulatory Visit
Admission: RE | Admit: 2020-10-01 | Discharge: 2020-10-01 | Disposition: A | Discharge status: Home / Self Care | Payer: BC Managed Care – PPO | Source: Ambulatory Visit | Attending: Family Medicine | Admitting: Family Medicine

## 2020-10-01 DIAGNOSIS — Z1231 Encounter for screening mammogram for malignant neoplasm of breast: Secondary | ICD-10-CM

## 2020-10-01 DIAGNOSIS — Z0000 Encounter for general adult medical examination without abnormal findings: Secondary | ICD-10-CM

## 2021-01-27 ENCOUNTER — Ambulatory Visit: Payer: BC Managed Care – PPO | Attending: Internal Medicine

## 2021-01-27 ENCOUNTER — Other Ambulatory Visit (HOSPITAL_BASED_OUTPATIENT_CLINIC_OR_DEPARTMENT_OTHER): Payer: Self-pay

## 2021-01-27 DIAGNOSIS — Z23 Encounter for immunization: Secondary | ICD-10-CM

## 2021-01-27 MED ORDER — PFIZER COVID-19 VAC BIVALENT 30 MCG/0.3ML IM SUSP
INTRAMUSCULAR | 0 refills | Status: DC
  Filled 2021-01-27: qty 0.3, 1d supply, fill #0

## 2021-01-27 NOTE — Progress Notes (Signed)
   Covid-19 Vaccination Clinic  Name:  Whitney Hines    MRN: [id number] DOB: 27-Oct-1950  01/27/2021  [name] was observed post Covid-19 immunization for 15 minutes without incident. She was provided with Vaccine Information Sheet and instruction to access the V-Safe system.   [name] was instructed to call 911 with any severe reactions post vaccine: Difficulty breathing  Swelling of face and throat  A fast heartbeat  A bad rash all over body  Dizziness and weakness   Immunizations Administered     Name Date Dose VIS Date Route   Pfizer Covid-19 Vaccine Bivalent Booster 01/27/2021  1:21 PM 0.3 mL 10/23/2020 Intramuscular   Manufacturer: [employer]   Lot: [id number]   [place]: [phone]

## 2021-02-03 ENCOUNTER — Other Ambulatory Visit: Payer: Self-pay | Admitting: Neurology

## 2021-02-03 DIAGNOSIS — G40309 Generalized idiopathic epilepsy and epileptic syndromes, not intractable, without status epilepticus: Secondary | ICD-10-CM

## 2021-02-03 NOTE — Telephone Encounter (Signed)
Verify Drug Registry For Phenobarbital 100mg  tablet Last Filled: 10/24/2020 Quantity: 90 for 90 days Last appointment:07/17/2020 Next appointment: 07/17/2021

## 2021-07-17 ENCOUNTER — Ambulatory Visit: Payer: BC Managed Care – PPO | Admitting: Neurology

## 2021-07-28 NOTE — Progress Notes (Unsigned)
PATIENT: Whitney Hines DOB: December 15, 1950  REASON FOR VISIT: follow up HISTORY FROM: patient PRIMARY NEUROLOGIST:   HISTORY OF PRESENT ILLNESS: Today 07/28/21  Whitney Hines is a 71 year old female with history of seizure disorder her last seizure was in 1999, only 3 seizures in her life ever.  She is doing well on phenobarbital.  Tolerates without side effect.  She continues to work full-time at [employer].  She is planning to retire next year.  She denies any medical issues.  She is due to see her PCP for physical next week.  Here today for evaluation unaccompanied.  07/13/2019 SS: Whitney Hines is a 71 year old female history of seizure disorder.  Her last seizure was in 1999.  She remains on phenobarbital.  She is tolerating well.  She continues to work full-time [employer].  She lives with her husband, drives a car.  She has lost about 30 pounds she reports.  She denies any new problems or concerns.  She is doing well.  Presents today for follow-up unaccompanied.  HISTORY 01/17/2019 SS: Whitney Hines is a 71 year old female with history of seizure disorder.  Her last seizure occurred in 1999.  She remains on phenobarbital. She is tolerating the medication without side effect.  She has continued to work full-time at [employer].  She lives with her husband, drives a car.  She denies any issues with balance or falls.  She denies any new problems or concerns.  She presents today for follow-up unaccompanied.    REVIEW OF SYSTEMS: Out of a complete 14 system review of symptoms, the patient complains only of the following symptoms, and all other reviewed systems are negative.  Seizure  ALLERGIES: Allergies  Allergen Reactions   Dilantin  [Phenytoin Sodium Extended] Hives   Dilantin [Phenytoin] Hives    HOME MEDICATIONS: Outpatient Medications Prior to Visit  Medication Sig Dispense Refill   COVID-19 mRNA bivalent vaccine, Pfizer, (PFIZER COVID-19 VAC BIVALENT) injection Inject into  the muscle. 0.3 mL 0   lisinopril-hydrochlorothiazide (ZESTORETIC) 20-12.5 MG tablet TAKE 1 TABLET BY MOUTH EVERY DAY 90 tablet 3   PHENObarbital (LUMINAL) 100 MG tablet TAKE 1 TABLET BY MOUTH EVERYDAY AT BEDTIME 90 tablet 1   No facility-administered medications prior to visit.    PAST MEDICAL HISTORY: Past Medical History:  Diagnosis Date   High blood pressure    Neuropathy    Osteopenia 04/26/2018   dexa 03/2018: lowest T = -1.6 femur. Recheck 2 years   Seizure [facility]) oct 1999    PAST SURGICAL HISTORY: Past Surgical History:  Procedure Laterality Date   COLONOSCOPY  2016   Dr.  Wynetta Emery   COLONOSCOPY WITH PROPOFOL N/A 01/07/2015   Procedure: COLONOSCOPY WITH PROPOFOL;  Surgeon: Garlan Fair, MD;  Location: [facility];  Service: Endoscopy;  Laterality: N/A;   MULTIPLE TOOTH EXTRACTIONS      FAMILY HISTORY: Family History  Problem Relation Age of Onset   Heart disease Mother    Heart attack Mother    Hypertension Father    Stroke Paternal Aunt    Stroke Paternal Uncle    Stroke Paternal Aunt    Colon cancer Neg Hx    Colon polyps Neg Hx    Esophageal cancer Neg Hx    Rectal cancer Neg Hx    Stomach cancer Neg Hx     SOCIAL HISTORY: Social History   Socioeconomic History   Marital status: Married    Spouse name: Not on file   Number  of children: 0   Years of education: BS   Highest education level: Not on file  Occupational History   Occupation: Buyer, retail: [employer]  Tobacco Use   Smoking status: Never   Smokeless tobacco: Never  Vaping Use   Vaping Use: Never used  Substance and Sexual Activity   Alcohol use: No    Alcohol/week: 0.0 standard drinks   Drug use: No   Sexual activity: Yes  Other Topics Concern   Not on file  Social History Narrative   Patient is married and works as a Therapist, 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    Patient has BS degree.    Patient does not have any children.     Patient drinks 8 ounces of caffeine daily.    Patient is left handed.    Social Determinants of Health   Financial Resource Strain: Not on file  Food Insecurity: Not on file  Transportation Needs: Not on file  Physical Activity: Not on file  Stress: Not on file  Social Connections: Not on file  Intimate Partner Violence: Not on file   PHYSICAL EXAM  There were no vitals filed for this visit.  There is no height or weight on file to calculate BMI.  Generalized: Well developed, in no acute distress   Neurological examination  Mentation: Alert oriented to time, place, history taking. Follows all commands speech and language fluent Cranial nerve II-XII: Pupils were equal round reactive to light. Extraocular movements were full, visual field were full on confrontational test. Facial sensation and strength were normal.  Head turning and shoulder shrug  were normal and symmetric. Motor: The motor testing reveals 5 over 5 strength of all 4 extremities. Good symmetric motor tone is noted throughout.  Sensory: Sensory testing is intact to soft touch on all 4 extremities. No evidence of extinction is noted.  Coordination: Cerebellar testing reveals good finger-nose-finger and heel-to-shin bilaterally.  Gait and station: Gait is normal. Tandem gait is normal. Romberg is negative. No drift is seen.  Reflexes: Deep tendon reflexes are symmetric and normal bilaterally.   DIAGNOSTIC DATA (LABS, IMAGING, TESTING) - I reviewed patient records, labs, notes, testing and imaging myself where available.  Lab Results  Component Value Date   WBC 6.1 07/26/2020   HGB 12.3 07/26/2020   HCT 35.8 (L) 07/26/2020   MCV 93.2 07/26/2020   PLT 263.0 07/26/2020      Component Value Date/Time   NA 140 07/26/2020 1137   NA 140 07/13/2019 1515   K 4.7 07/26/2020 1137   CL 104 07/26/2020 1137   CO2 23 07/26/2020 1137   GLUCOSE 70 07/26/2020 1137   BUN 33 (H) 07/26/2020 1137   BUN 29 (H) 07/13/2019 1515    CREATININE 1.38 (H) 07/26/2020 1137   CALCIUM 9.7 07/26/2020 1137   PROT 8.5 (H) 07/26/2020 1137   PROT 8.3 07/13/2019 1515   ALBUMIN 4.0 07/26/2020 1137   ALBUMIN 4.5 07/13/2019 1515   AST 30 07/26/2020 1137   ALT 18 07/26/2020 1137   ALKPHOS 114 07/26/2020 1137   BILITOT 0.3 07/26/2020 1137   BILITOT 0.2 07/13/2019 1515   GFRNONAA 39 (L) 07/13/2019 1515   GFRAA 45 (L) 07/13/2019 1515   Lab Results  Component Value Date   CHOL 167 07/26/2020   HDL 39.30 07/26/2020   LDLCALC 94 07/26/2020   LDLDIRECT 87.0 02/25/2017   TRIG 171.0 (H) 07/26/2020   CHOLHDL 4 07/26/2020  No results found for: HGBA1C No results found for: VITAMINB12 Lab Results  Component Value Date   TSH 2.58 07/26/2020   ASSESSMENT AND PLAN 71 y.o. year old female  has a past medical history of High blood pressure, Neuropathy, Osteopenia (04/26/2018), and Seizure ([place]) (oct 1999). here with:  1.  History of seizures, well controlled  -Whitney Hines is doing great and is such a delight -Continue phenobarbital at current dose, refill was sent in, drug registry reviewed, last filled 04/26/20 # 90 -Phenobarbital level in May 2021 was 29, has been stable for several years -Seeing PCP next week, can check routine labs at that time, in May 2021 creatinine had bumped up 1.40 -Follow-up in 1 year or sooner if needed  Butler Denmark, AGNP-C, DNP 07/28/2021, 10:22 AM [facility] [address] [place], [place] [address] [phone]

## 2021-07-29 ENCOUNTER — Encounter: Payer: Self-pay | Admitting: Neurology

## 2021-07-29 ENCOUNTER — Ambulatory Visit (INDEPENDENT_AMBULATORY_CARE_PROVIDER_SITE_OTHER): Payer: BC Managed Care – PPO | Admitting: Neurology

## 2021-07-29 VITALS — BP 143/79 | HR 72 | Ht 61.0 in | Wt 162.0 lb

## 2021-07-29 DIAGNOSIS — G40309 Generalized idiopathic epilepsy and epileptic syndromes, not intractable, without status epilepticus: Secondary | ICD-10-CM | POA: Diagnosis not present

## 2021-07-29 MED ORDER — PHENOBARBITAL 100 MG PO TABS: ORAL_TABLET | ORAL | 1 refills | Status: DC

## 2021-07-29 NOTE — Patient Instructions (Signed)
Great to see you today! Check for labs  Meds ordered this encounter  Medications   PHENObarbital (LUMINAL) 100 MG tablet    Sig: Take 1 tablet at bedtime    Dispense:  90 tablet    Refill:  1    This request is for a new prescription for a controlled substance as required by Federal/State law.

## 2021-07-30 LAB — COMPREHENSIVE METABOLIC PANEL
ALT: 19 IU/L (ref 0–32)
AST: 27 IU/L (ref 0–40)
Albumin/Globulin Ratio: 1 — ABNORMAL LOW (ref 1.2–2.2)
Albumin: 4.2 g/dL (ref 3.8–4.8)
Alkaline Phosphatase: 141 IU/L — ABNORMAL HIGH (ref 44–121)
BUN/Creatinine Ratio: 19 (ref 12–28)
BUN: 24 mg/dL (ref 8–27)
Bilirubin Total: 0.3 mg/dL (ref 0.0–1.2)
CO2: 22 mmol/L (ref 20–29)
Calcium: 9.5 mg/dL (ref 8.7–10.3)
Chloride: 101 mmol/L (ref 96–106)
Creatinine, Ser: 1.27 mg/dL — ABNORMAL HIGH (ref 0.57–1.00)
Globulin, Total: 4.3 g/dL (ref 1.5–4.5)
Glucose: 90 mg/dL (ref 70–99)
Potassium: 4.2 mmol/L (ref 3.5–5.2)
Sodium: 138 mmol/L (ref 134–144)
Total Protein: 8.5 g/dL (ref 6.0–8.5)
eGFR: 46 mL/min/{1.73_m2} — ABNORMAL LOW (ref >59)

## 2021-07-30 LAB — CBC WITH DIFFERENTIAL/PLATELET
Basophils Absolute: 0 10*3/uL (ref 0.0–0.2)
Basos: 0 %
EOS (ABSOLUTE): 0 10*3/uL (ref 0.0–0.4)
Eos: 0 %
Hematocrit: 34.2 % (ref 34.0–46.6)
Hemoglobin: 11.5 g/dL (ref 11.1–15.9)
Immature Grans (Abs): 0 10*3/uL (ref 0.0–0.1)
Immature Granulocytes: 0 %
Lymphocytes Absolute: 3.5 10*3/uL — ABNORMAL HIGH (ref 0.7–3.1)
Lymphs: 41 %
MCH: 30.7 pg (ref 26.6–33.0)
MCHC: 33.6 g/dL (ref 31.5–35.7)
MCV: 91 fL (ref 79–97)
Monocytes Absolute: 0.3 10*3/uL (ref 0.1–0.9)
Monocytes: 4 %
Neutrophils Absolute: 4.6 10*3/uL (ref 1.4–7.0)
Neutrophils: 55 %
Platelets: 286 10*3/uL (ref 150–450)
RBC: 3.75 x10E6/uL — ABNORMAL LOW (ref 3.77–5.28)
RDW: 12.5 % (ref 11.7–15.4)
WBC: 8.4 10*3/uL (ref 3.4–10.8)

## 2021-07-30 LAB — PHENOBARBITAL LEVEL: Phenobarbital, Serum: 31 ug/mL (ref 15–40)

## 2021-07-30 LAB — VITAMIN D 25 HYDROXY (VIT D DEFICIENCY, FRACTURES): Vit D, 25-Hydroxy: 36.4 ng/mL (ref 30.0–100.0)

## 2021-08-15 ENCOUNTER — Other Ambulatory Visit: Payer: Self-pay | Admitting: Family Medicine

## 2021-08-20 ENCOUNTER — Ambulatory Visit: Payer: BC Managed Care – PPO | Admitting: Family Medicine

## 2021-08-20 ENCOUNTER — Encounter: Payer: Self-pay | Admitting: Family Medicine

## 2021-08-20 VITALS — BP 124/70 | HR 76 | Temp 98.1°F | Ht 61.0 in | Wt 170.8 lb

## 2021-08-20 DIAGNOSIS — I10 Essential (primary) hypertension: Secondary | ICD-10-CM | POA: Diagnosis not present

## 2021-08-20 DIAGNOSIS — D126 Benign neoplasm of colon, unspecified: Secondary | ICD-10-CM

## 2021-08-20 DIAGNOSIS — G40309 Generalized idiopathic epilepsy and epileptic syndromes, not intractable, without status epilepticus: Secondary | ICD-10-CM | POA: Diagnosis not present

## 2021-08-20 DIAGNOSIS — N1831 Chronic kidney disease, stage 3a: Secondary | ICD-10-CM

## 2021-08-20 DIAGNOSIS — M8580 Other specified disorders of bone density and structure, unspecified site: Secondary | ICD-10-CM

## 2021-08-20 DIAGNOSIS — Z0000 Encounter for general adult medical examination without abnormal findings: Secondary | ICD-10-CM | POA: Diagnosis not present

## 2021-08-20 MED ORDER — LISINOPRIL-HYDROCHLOROTHIAZIDE 20-12.5 MG PO TABS: 1.0000 | ORAL_TABLET | Freq: Every day | ORAL | 3 refills | Status: AC

## 2021-08-20 NOTE — Progress Notes (Unsigned)
Subjective  Chief Complaint  Patient presents with   Medication Refill    Pt here to F/U with Bp and to get refill.    HPI: Whitney Hines is a 71 y.o. female who presents to [place] [facility] today for a Female Wellness Visit. She also has the concerns and/or needs as listed above in the chief complaint. These will be addressed in addition to the Health Maintenance Visit.   Wellness Visit: annual visit with health maintenance review and exam {With-without:32421} Pap  *** Chronic disease f/u and/or acute problem visit: (deemed necessary to be done in addition to the wellness visit): ***   Assessment  1. Annual physical exam   2. Essential hypertension   3. Tubular adenoma of colon   4. Generalized idiopathic epilepsy and epileptic syndromes, without status epilepticus, not intractable ([place])   5. Osteopenia, unspecified location   6. Stage 3a chronic kidney disease [facility])      Plan  Female Wellness Visit: Age appropriate Health Maintenance and Prevention measures were discussed with patient. Included topics are cancer screening recommendations, ways to keep healthy (see AVS) including dietary and exercise recommendations, regular eye and dental care, use of seat belts, and avoidance of moderate alcohol use and tobacco use.  BMI: discussed patient's BMI and encouraged positive lifestyle modifications to help get to or maintain a target BMI. HM needs and immunizations were addressed and ordered. See below for orders. See HM and immunization section for updates. Routine labs and screening tests ordered including cmp, cbc and lipids where appropriate. Discussed recommendations regarding Vit D and calcium supplementation (see AVS)  Chronic disease management visit and/or acute problem visit: *** Follow up: No follow-ups on file.  Orders Placed This Encounter  Procedures   CBC with Differential/Platelet   Comprehensive metabolic panel   Lipid panel    Hemoglobin A1c   TSH   Meds ordered this encounter  Medications   lisinopril-hydrochlorothiazide (ZESTORETIC) 20-12.5 MG tablet    Sig: Take 1 tablet by mouth daily.    Dispense:  90 tablet    Refill:  3      Body mass index is 30.61 kg/m. Wt Readings from Last 3 Encounters:  07/29/21 162 lb (73.5 kg)  07/26/20 162 lb 12.8 oz (73.8 kg)  07/17/20 163 lb (73.9 kg)     Patient Active Problem List   Diagnosis Date Noted   Tubular adenoma of colon 01/10/2015    Priority: High    Overview:  Colonoscopy 12/2014, q3-5 year f/u Colonoscopy 2021, recheck 3 years.     BMI 30.0-30.9,adult 12/31/2014    Priority: High   Essential hypertension 12/31/2014    Priority: High   Generalized idiopathic epilepsy and epileptic syndromes, without status epilepticus, not intractable ([place]) 05/15/2013    Priority: High   CKD (chronic kidney disease) stage 3, GFR 30-59 ml/min ([place]) 08/08/2020    Priority: Medium    Osteopenia 04/26/2018    Priority: Medium     dexa 03/2018: lowest T = -1.6 femur. Recheck 2 years DEXA 07/2020: lowest T = - 1.5; osteopenic; recheck 2 years.      Unilateral post-traumatic osteoarthritis, right knee 02/25/2017    Priority: Medium    Health Maintenance  Topic Date Due   COVID-19 Vaccine (5 - Pfizer series) 05/28/2021   INFLUENZA VACCINE  09/23/2021   MAMMOGRAM  10/01/2021   DEXA SCAN  07/31/2022   COLONOSCOPY (Pts 45-58yr Insurance coverage will need to be confirmed)  12/08/2022  TETANUS/TDAP  12/30/2024   Pneumonia Vaccine 42+ Years old  Completed   Hepatitis C Screening  Completed   Zoster Vaccines- Shingrix  Completed   HPV VACCINES  Aged Out   Immunization History  Administered Date(s) Administered   Influenza, High Dose Seasonal PF 02/07/2018   Influenza, Seasonal, Injecte, Preservative Fre 12/31/2014   Influenza,inj,Quad PF,6+ Mos 11/21/2015, 02/25/2017   PFIZER(Purple Top)SARS-COV-2 Vaccination 04/16/2019, 05/10/2019, 01/06/2020   Pfizer  Covid-19 Vaccine Bivalent Booster 72yr & up 01/27/2021   Pneumococcal Conjugate-13 08/16/2017   Pneumococcal Polysaccharide-23 08/09/2018   Tdap 12/31/2014   Zoster Recombinat (Shingrix) 02/07/2018, 07/26/2020   Zoster, Live 02/22/2015   We updated and reviewed the patient's past history in detail and it is documented below. Allergies: Patient is allergic to dilantin  [phenytoin sodium extended] and dilantin [phenytoin]. Past Medical History Patient  has a past medical history of High blood pressure, Neuropathy, Osteopenia (04/26/2018), and [place]izure [place] (oct 1999). Past Surgical History Patient  has a past surgical history that includes Multiple tooth extractions; Colonoscopy with propofol (N/A, 01/07/2015); and Colonoscopy (2016). Family History: Patient family history includes Heart attack in her mother; Heart disease in her mother; Hypertension in her father; Stroke in her paternal aunt, paternal aunt, and paternal uncle. Social History:  Patient  reports that she has never smoked. She has never used smokeless tobacco. She reports that she does not drink alcohol and does not use drugs.  Review of Systems: Constitutional: negative for fever or malaise Ophthalmic: negative for photophobia, double vision or loss of vision Cardiovascular: negative for chest pain, dyspnea on exertion, or new LE swelling Respiratory: negative for SOB or persistent cough Gastrointestinal: negative for abdominal pain, change in bowel habits or melena Genitourinary: negative for dysuria or gross hematuria, no abnormal uterine bleeding or disharge Musculoskeletal: negative for new gait disturbance or muscular weakness Integumentary: negative for new or persistent rashes, no breast lumps Neurological: negative for TIA or stroke symptoms Psychiatric: negative for SI or delusions Allergic/Immunologic: negative for hives  Patient Care Team    Relationship Specialty Notifications Start End  ALeamon Arnt MD  PCP - General Family Medicine  01/07/15     Objective  Vitals: Ht '5\' 1"'$  (1.549 m)   LMP  (LMP Unknown) Comment: LMP 2004  BMI 30.61 kg/m  General:  Well developed, well nourished, no acute distress  Psych:  Alert and orientedx3,normal mood and affect HEENT:  Normocephalic, atraumatic, non-icteric sclera,  supple neck without adenopathy, mass or thyromegaly Cardiovascular:  Normal S1, S2, RRR without gallop, rub or murmur Respiratory:  Good breath sounds bilaterally, CTAB with normal respiratory effort Gastrointestinal: normal bowel sounds, soft, non-tender, no noted masses. No HSM MSK: no deformities, contusions. Joints are without erythema or swelling.  Skin:  Warm, no rashes or suspicious lesions noted Neurologic:    Mental status is normal. CN 2-11 are normal. Gross motor and sensory exams are normal. Normal gait. No tremor Breast Exam: No mass, skin retraction or nipple discharge is appreciated in either breast. No axillary adenopathy. Fibrocystic changes {Actions; are/are not:16769} noted Pelvic Exam: Normal external genitalia, no vulvar or vaginal lesions present. Clear cervix w/o CMT. Bimanual exam reveals a nontender fundus w/o masses, nl size. No adnexal masses present. No inguinal adenopathy. A PAP smear {WAS/WAS NOT:[phone]::"was not"} performed.   Commons side effects, risks, benefits, and alternatives for medications and treatment plan prescribed today were discussed, and the patient expressed understanding of the given instructions. Patient is instructed to call or message via  MyChart if he/she has any questions or concerns regarding our treatment plan. No barriers to understanding were identified. We discussed Red Flag symptoms and signs in detail. Patient expressed understanding regarding what to do in case of urgent or emergency type symptoms.  Medication list was reconciled, printed and provided to the patient in AVS. Patient instructions and summary information was reviewed  with the patient as documented in the AVS. This note was prepared with assistance of Dragon voice recognition software. Occasional wrong-word or sound-a-like substitutions may have occurred due to the inherent limitations of voice recognition software  This visit occurred during the SARS-CoV-2 public health emergency.  Safety protocols were in place, including screening questions prior to the visit, additional usage of staff PPE, and extensive cleaning of exam room while observing appropriate contact time as indicated for disinfecting solutions.

## 2021-08-20 NOTE — Patient Instructions (Signed)

## 2021-08-21 LAB — CBC WITH DIFFERENTIAL/PLATELET
Basophils Absolute: 0.1 10*3/uL (ref 0.0–0.1)
Basophils Relative: 1.1 % (ref 0.0–3.0)
Eosinophils Absolute: 0 10*3/uL (ref 0.0–0.7)
Eosinophils Relative: 0.2 % (ref 0.0–5.0)
HCT: 33.7 % — ABNORMAL LOW (ref 36.0–46.0)
Hemoglobin: 11.3 g/dL — ABNORMAL LOW (ref 12.0–15.0)
Lymphocytes Relative: 40.5 % (ref 12.0–46.0)
Lymphs Abs: 3 10*3/uL (ref 0.7–4.0)
MCHC: 33.6 g/dL (ref 30.0–36.0)
MCV: 93.6 fl (ref 78.0–100.0)
Monocytes Absolute: 0.5 10*3/uL (ref 0.1–1.0)
Monocytes Relative: 6.2 % (ref 3.0–12.0)
Neutro Abs: 3.9 10*3/uL (ref 1.4–7.7)
Neutrophils Relative %: 52 % (ref 43.0–77.0)
Platelets: 256 10*3/uL (ref 150.0–400.0)
RBC: 3.59 Mil/uL — ABNORMAL LOW (ref 3.87–5.11)
RDW: 13.3 % (ref 11.5–15.5)
WBC: 7.5 10*3/uL (ref 4.0–10.5)

## 2021-08-21 LAB — COMPREHENSIVE METABOLIC PANEL
ALT: 18 U/L (ref 0–35)
AST: 26 U/L (ref 0–37)
Albumin: 4.1 g/dL (ref 3.5–5.2)
Alkaline Phosphatase: 117 U/L (ref 39–117)
BUN: 33 mg/dL — ABNORMAL HIGH (ref 6–23)
CO2: 27 mEq/L (ref 19–32)
Calcium: 9.8 mg/dL (ref 8.4–10.5)
Chloride: 100 mEq/L (ref 96–112)
Creatinine, Ser: 1.43 mg/dL — ABNORMAL HIGH (ref 0.40–1.20)
GFR: 37.49 mL/min — ABNORMAL LOW (ref >60.00)
Glucose, Bld: 59 mg/dL — ABNORMAL LOW (ref 70–99)
Potassium: 4.5 mEq/L (ref 3.5–5.1)
Sodium: 135 mEq/L (ref 135–145)
Total Bilirubin: 0.4 mg/dL (ref 0.2–1.2)
Total Protein: 8.4 g/dL — ABNORMAL HIGH (ref 6.0–8.3)

## 2021-08-21 LAB — LIPID PANEL
Cholesterol: 169 mg/dL (ref 0–200)
HDL: 39.9 mg/dL (ref >39.00)
LDL Cholesterol: 94 mg/dL (ref 0–99)
NonHDL: 129.15
Total CHOL/HDL Ratio: 4
Triglycerides: 174 mg/dL — ABNORMAL HIGH (ref 0.0–149.0)
VLDL: 34.8 mg/dL (ref 0.0–40.0)

## 2021-08-21 LAB — HEMOGLOBIN A1C: Hgb A1c MFr Bld: 5.7 % (ref 4.6–6.5)

## 2021-08-21 LAB — TSH: TSH: 2.96 u[IU]/mL (ref 0.35–5.50)

## 2021-08-22 ENCOUNTER — Encounter: Payer: Self-pay | Admitting: Family Medicine

## 2021-08-22 DIAGNOSIS — E782 Mixed hyperlipidemia: Secondary | ICD-10-CM | POA: Insufficient documentation

## 2021-08-28 ENCOUNTER — Other Ambulatory Visit: Payer: Self-pay

## 2021-08-28 DIAGNOSIS — I10 Essential (primary) hypertension: Secondary | ICD-10-CM

## 2021-08-28 MED ORDER — ROSUVASTATIN CALCIUM 5 MG PO TABS: 5.0000 mg | ORAL_TABLET | Freq: Every evening | ORAL | 3 refills | Status: DC

## 2021-10-01 ENCOUNTER — Other Ambulatory Visit: Payer: Self-pay | Admitting: Family Medicine

## 2021-10-01 DIAGNOSIS — Z1231 Encounter for screening mammogram for malignant neoplasm of breast: Secondary | ICD-10-CM

## 2021-11-04 ENCOUNTER — Ambulatory Visit: Payer: BC Managed Care – PPO

## 2021-11-17 ENCOUNTER — Ambulatory Visit: Payer: BC Managed Care – PPO

## 2021-11-17 ENCOUNTER — Encounter: Payer: Self-pay | Admitting: *Deleted

## 2021-12-29 ENCOUNTER — Ambulatory Visit
Admission: RE | Admit: 2021-12-29 | Discharge: 2021-12-29 | Disposition: A | Discharge status: Home / Self Care | Payer: BC Managed Care – PPO | Source: Ambulatory Visit | Attending: Family Medicine | Admitting: Family Medicine

## 2021-12-29 DIAGNOSIS — Z1231 Encounter for screening mammogram for malignant neoplasm of breast: Secondary | ICD-10-CM

## 2022-02-05 ENCOUNTER — Encounter: Payer: Self-pay | Admitting: *Deleted

## 2022-02-11 ENCOUNTER — Other Ambulatory Visit: Payer: Self-pay | Admitting: Neurology

## 2022-02-11 DIAGNOSIS — G40309 Generalized idiopathic epilepsy and epileptic syndromes, not intractable, without status epilepticus: Secondary | ICD-10-CM

## 2022-07-28 ENCOUNTER — Ambulatory Visit (INDEPENDENT_AMBULATORY_CARE_PROVIDER_SITE_OTHER): Payer: BC Managed Care – PPO | Admitting: Neurology

## 2022-07-28 ENCOUNTER — Encounter: Payer: Self-pay | Admitting: Neurology

## 2022-07-28 DIAGNOSIS — G40309 Generalized idiopathic epilepsy and epileptic syndromes, not intractable, without status epilepticus: Secondary | ICD-10-CM

## 2022-07-28 MED ORDER — PHENOBARBITAL 100 MG PO TABS: ORAL_TABLET | ORAL | 1 refills | Status: DC

## 2022-07-28 NOTE — Progress Notes (Signed)
PATIENT: Whitney Hines DOB: [birth year]  REASON FOR VISIT: follow up for seizures  HISTORY FROM: patient PRIMARY NEUROLOGIST: Dr. Teresa Coombs  HISTORY OF PRESENT ILLNESS: Today 07/28/22  Labs last year June 2023 vitamin D 36.4, creatinine 1.27, phenobarbital 31. Has a new PCP, who did labs recently that are not available to me. No seizures since 1999. No health issues. Continues to work at [employer]. Is really doing great. No new issues. Takes Vitamin D.   Update 07/29/21 SS: Whitney Hines is here today for follow-up.  Remains on phenobarbital.  No recent seizures. Labs from PCP 07/26/20 creatinine 1.38. continues to work full time, enjoys it. In PT for left knee. Health is good, looking for new PCP. Looks great today.   Update 07/17/2020 SS: Whitney Hines is a 72 year old female with history of seizure disorder her last seizure was in 1999, only 3 seizures in her life ever.  She is doing well on phenobarbital.  Tolerates without side effect.  She continues to work full-time at [employer].  She is planning to retire next year.  She denies any medical issues.  She is due to see her PCP for physical next week.  Here today for evaluation unaccompanied.  07/13/2019 SS: Whitney Hines is a 72 year old female history of seizure disorder.  Her last seizure was in 1999.  She remains on phenobarbital.  She is tolerating well.  She continues to work full-time [employer].  She lives with her husband, drives a car.  She has lost about 30 pounds she reports.  She denies any new problems or concerns.  She is doing well.  Presents today for follow-up unaccompanied.  HISTORY 01/17/2019 SS: Whitney Hines is a 72 year old female with history of seizure disorder.  Her last seizure occurred in 1999.  She remains on phenobarbital. She is tolerating the medication without side effect.  She has continued to work full-time at [employer].  She lives with her husband, drives a car.  She denies any issues with balance or falls.  She denies  any new problems or concerns.  She presents today for follow-up unaccompanied.    REVIEW OF SYSTEMS: Out of a complete 14 system review of symptoms, the patient complains only of the following symptoms, and all other reviewed systems are negative.  See HPI  ALLERGIES: Allergies  Allergen Reactions   Dilantin  [Phenytoin Sodium Extended] Hives   Dilantin [Phenytoin] Hives    HOME MEDICATIONS: Outpatient Medications Prior to Visit  Medication Sig Dispense Refill   lisinopril-hydrochlorothiazide (ZESTORETIC) 20-12.5 MG tablet Take 1 tablet by mouth daily. 90 tablet 3   PHENObarbital (LUMINAL) 100 MG tablet TAKE 1 TABLET BY MOUTH EVERYDAY AT BEDTIME 90 tablet 1   rosuvastatin (CRESTOR) 5 MG tablet Take 1 tablet (5 mg total) by mouth at bedtime. 90 tablet 3   No facility-administered medications prior to visit.    PAST MEDICAL HISTORY: Past Medical History:  Diagnosis Date   High blood pressure    Neuropathy    Osteopenia 04/26/2018   dexa 03/2018: lowest T = -1.6 femur. Recheck 2 years   Seizure [facility]) oct 1999    PAST SURGICAL HISTORY: Past Surgical History:  Procedure Laterality Date   COLONOSCOPY  2016   Dr.  Laural Benes   COLONOSCOPY WITH PROPOFOL N/A 01/07/2015   Procedure: COLONOSCOPY WITH PROPOFOL;  Surgeon: Charolett Bumpers, MD;  Location: [facility];  Service: Endoscopy;  Laterality: N/A;   MULTIPLE TOOTH EXTRACTIONS      FAMILY HISTORY:  Family History  Problem Relation Age of Onset   Heart disease Mother    Heart attack Mother    Hypertension Father    Stroke Paternal Aunt    Stroke Paternal Uncle    Stroke Paternal Aunt    Colon cancer Neg Hx    Colon polyps Neg Hx    Esophageal cancer Neg Hx    Rectal cancer Neg Hx    Stomach cancer Neg Hx     SOCIAL HISTORY: Social History   Socioeconomic History   Marital status: Married    Spouse name: Not on file   Number of children: 0   Years of education: BS   Highest education level: Not on file   Occupational History   Occupation: Event organiser: [employer]  Tobacco Use   Smoking status: Never   Smokeless tobacco: Never  Vaping Use   Vaping Use: Never used  Substance and Sexual Activity   Alcohol use: No    Alcohol/week: 0.0 standard drinks of alcohol   Drug use: No   Sexual activity: Yes    Birth control/protection: None  Other Topics Concern   Not on file  Social History Narrative   Patient is married and works as a Producer, 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    Patient has BS degree.    Patient does not have any children.    Patient drinks 8 ounces of caffeine daily.    Patient is left handed.    Social Determinants of Health   Financial Resource Strain: Not on file  Food Insecurity: Not on file  Transportation Needs: Not on file  Physical Activity: Not on file  Stress: Not on file  Social Connections: Not on file  Intimate Partner Violence: Not on file   PHYSICAL EXAM  Vitals:   07/28/22 1504  BP: 127/75  Pulse: 82  Weight: 174 lb (78.9 kg)  Height: 5\' 1"  (1.549 m)     Body mass index is 32.88 kg/m.  Generalized: Well developed, in no acute distress   Neurological examination  Mentation: Alert oriented to time, place, history taking. Follows all commands speech and language fluent Cranial nerve II-XII: Pupils were equal round reactive to light. Extraocular movements were full, visual field were full on confrontational test. Facial sensation and strength were normal.  Head turning and shoulder shrug  were normal and symmetric. Motor: The motor testing reveals 5 over 5 strength of all 4 extremities. Good symmetric motor tone is noted throughout.  Sensory: Sensory testing is intact to soft touch on all 4 extremities. No evidence of extinction is noted.  Coordination: Cerebellar testing reveals good finger-nose-finger and heel-to-shin bilaterally.  Gait and station: Gait is normal. Tandem gait is normal.   Reflexes: Deep tendon reflexes are symmetric and normal bilaterally.   DIAGNOSTIC DATA (LABS, IMAGING, TESTING) - I reviewed patient records, labs, notes, testing and imaging myself where available.  Lab Results  Component Value Date   WBC 7.5 08/20/2021   HGB 11.3 (L) 08/20/2021   HCT 33.7 (L) 08/20/2021   MCV 93.6 08/20/2021   PLT 256.0 08/20/2021      Component Value Date/Time   NA 135 08/20/2021 1621   NA 138 07/29/2021 1618   K 4.5 08/20/2021 1621   CL 100 08/20/2021 1621   CO2 27 08/20/2021 1621   GLUCOSE 59 (L) 08/20/2021 1621   BUN 33 (H) 08/20/2021 1621   BUN 24 07/29/2021 1618  CREATININE 1.43 (H) 08/20/2021 1621   CALCIUM 9.8 08/20/2021 1621   PROT 8.4 (H) 08/20/2021 1621   PROT 8.5 07/29/2021 1618   ALBUMIN 4.1 08/20/2021 1621   ALBUMIN 4.2 07/29/2021 1618   AST 26 08/20/2021 1621   ALT 18 08/20/2021 1621   ALKPHOS 117 08/20/2021 1621   BILITOT 0.4 08/20/2021 1621   BILITOT 0.3 07/29/2021 1618   GFRNONAA 39 (L) 07/13/2019 1515   GFRAA 45 (L) 07/13/2019 1515   Lab Results  Component Value Date   CHOL 169 08/20/2021   HDL 39.90 08/20/2021   LDLCALC 94 08/20/2021   LDLDIRECT 87.0 02/25/2017   TRIG 174.0 (H) 08/20/2021   CHOLHDL 4 08/20/2021   Lab Results  Component Value Date   HGBA1C 5.7 08/20/2021   No results found for: "VITAMINB12" Lab Results  Component Value Date   TSH 2.96 08/20/2021   ASSESSMENT AND PLAN 72 y.o. year old female  has a past medical history of High blood pressure, Neuropathy, Osteopenia (04/26/2018), and Seizure (HCC) (oct 1999). here with:  1.  History of seizures, well controlled  -Doing wonderful, no seizures since 1999 -Continue Phenobarbital for seizure prevention  -PCP follows labs, she is on Vitamin D, will ask PCP to check phenobarbital at next lab draw, has historically been very stable -Call for seizures, return here in 1 year or sooner if needed  Meds ordered this encounter  Medications   PHENObarbital  (LUMINAL) 100 MG tablet    Sig: TAKE 1 TABLET BY MOUTH EVERYDAY AT BEDTIME    Dispense:  90 tablet    Refill:  1    This request is for a new prescription for a controlled substance as required by Federal/State law.   Margie Ege, AGNP-C, DNP 07/28/2022, 3:19 PM [facility] [address], [address] [place], [place] [address] [phone]

## 2022-08-12 ENCOUNTER — Other Ambulatory Visit: Payer: Self-pay | Admitting: Neurology

## 2022-08-12 DIAGNOSIS — G40309 Generalized idiopathic epilepsy and epileptic syndromes, not intractable, without status epilepticus: Secondary | ICD-10-CM

## 2022-08-13 NOTE — Telephone Encounter (Signed)
Requested Prescriptions   Pending Prescriptions Disp Refills   PHENObarbital (LUMINAL) 100 MG tablet [Pharmacy Med Name: PHENOBARBITAL 100 MG TABLET] 90 tablet     Sig: TAKE 1 TABLET BY MOUTH EVERYDAY AT BEDTIME   Last seen 07/28/22, next 08/03/23 Dispenses   Dispensed Days Supply Quantity Provider Pharmacy  PHENOBARBITAL 100 MG TABLET 05/13/2022 90 90 each Glean Salvo, NP [facility] [phone] - G...  PHENOBARBITAL 100 MG TABLET 02/12/2022 90 90 each Glean Salvo, NP [facility] [phone] - G...  PHENOBARBITAL 100 MG TABLET 11/07/2021 90 90 each Glean Salvo, NP [facility] [phone] - G.Marland KitchenMarland Kitchen

## 2022-12-04 ENCOUNTER — Other Ambulatory Visit: Payer: Self-pay | Admitting: Family Medicine

## 2022-12-04 DIAGNOSIS — Z1231 Encounter for screening mammogram for malignant neoplasm of breast: Secondary | ICD-10-CM

## 2023-01-01 ENCOUNTER — Ambulatory Visit
Admission: RE | Admit: 2023-01-01 | Discharge: 2023-01-01 | Disposition: A | Discharge status: Home / Self Care | Payer: BC Managed Care – PPO | Source: Ambulatory Visit | Attending: Family Medicine | Admitting: Family Medicine

## 2023-01-01 DIAGNOSIS — Z1231 Encounter for screening mammogram for malignant neoplasm of breast: Secondary | ICD-10-CM

## 2023-02-05 ENCOUNTER — Ambulatory Visit: Payer: BC Managed Care – PPO | Admitting: *Deleted

## 2023-02-05 VITALS — Ht 61.0 in | Wt 168.0 lb

## 2023-02-05 DIAGNOSIS — Z860100 Personal history of colon polyps, unspecified: Secondary | ICD-10-CM

## 2023-02-05 MED ORDER — NA SULFATE-K SULFATE-MG SULF 17.5-3.13-1.6 GM/177ML PO SOLN
1.0000 | Freq: Once | ORAL | 0 refills | Status: AC
Start: 2023-02-05 — End: 2023-02-05

## 2023-02-05 NOTE — Progress Notes (Signed)
Pre visit completed over telephone.  Instructions forwarded through MyChart.    No egg or soy allergy known to patient  No issues known to pt with past sedation with any surgeries or procedures Patient denies ever being told they had issues or difficulty with intubation  No FH of Malignant Hyperthermia Pt is not on diet pills Pt is not on  home 02  Pt is not on blood thinners  Pt denies issues with constipation  No A fib or A flutter Have any cardiac testing pending-NO Pt instructed to use [email or web address] or GoodRx for a price reduction on prep   

## 2023-02-26 ENCOUNTER — Other Ambulatory Visit: Payer: Self-pay | Admitting: Neurology

## 2023-02-26 DIAGNOSIS — G40309 Generalized idiopathic epilepsy and epileptic syndromes, not intractable, without status epilepticus: Secondary | ICD-10-CM

## 2023-03-02 NOTE — Telephone Encounter (Signed)
 Requested Prescriptions   Pending Prescriptions Disp Refills   PHENObarbital  (LUMINAL) 100 MG tablet [Pharmacy Med Name: PHENOBARBITAL  100 MG TABLET] 90 tablet     Sig: TAKE 1 TABLET BY MOUTH EVERYDAY AT BEDTIME   Last seen 07/28/22, next appt 08/03/23 Dispenses   Dispensed Days Supply Quantity Provider Pharmacy  PHENOBARBITAL  100 MG TABLET 11/13/2022 90 90 each Gayland Lauraine PARAS, NP [facility] [phone] - G...  PHENOBARBITAL  100 MG TABLET 08/13/2022 90 90 each Gayland Lauraine PARAS, NP [facility] [phone] - G...  PHENOBARBITAL  100 MG TABLET 05/13/2022 90 90 each Gayland Lauraine PARAS, NP [facility] [phone] - G.SABRASABRA

## 2023-03-03 ENCOUNTER — Encounter: Payer: Self-pay | Admitting: Gastroenterology

## 2023-03-04 ENCOUNTER — Ambulatory Visit (AMBULATORY_SURGERY_CENTER): Payer: BC Managed Care – PPO | Admitting: Gastroenterology

## 2023-03-04 ENCOUNTER — Encounter: Payer: Self-pay | Admitting: Gastroenterology

## 2023-03-04 VITALS — BP 140/68 | HR 68 | Temp 97.2°F | Resp 22 | Ht 61.0 in | Wt 168.0 lb

## 2023-03-04 DIAGNOSIS — D122 Benign neoplasm of ascending colon: Secondary | ICD-10-CM | POA: Diagnosis not present

## 2023-03-04 DIAGNOSIS — Z1211 Encounter for screening for malignant neoplasm of colon: Secondary | ICD-10-CM | POA: Diagnosis present

## 2023-03-04 DIAGNOSIS — Z860101 Personal history of adenomatous and serrated colon polyps: Secondary | ICD-10-CM | POA: Diagnosis not present

## 2023-03-04 DIAGNOSIS — Z860100 Personal history of colon polyps, unspecified: Secondary | ICD-10-CM

## 2023-03-04 MED ORDER — SODIUM CHLORIDE 0.9 % IV SOLN: 500.0000 mL | INTRAVENOUS | Status: DC

## 2023-03-04 MED ORDER — FLEET ENEMA RE ENEM
1.0000 | ENEMA | Freq: Once | RECTAL | Status: AC
  Administered 2023-03-04: 1 via RECTAL

## 2023-03-04 NOTE — Patient Instructions (Signed)
 Please read handouts provided. Continue present medications. Await pathology results. Repeat colonoscopy in 5 years for screening. Return to GI office as needed.   YOU HAD AN ENDOSCOPIC PROCEDURE TODAY AT THE [facility]:   Refer to the procedure report that was given to you for any specific questions about what was found during the examination.  If the procedure report does not answer your questions, please call your gastroenterologist to clarify.  If you requested that your care partner not be given the details of your procedure findings, then the procedure report has been included in a sealed envelope for you to review at your convenience later.  YOU SHOULD EXPECT: Some feelings of bloating in the abdomen. Passage of more gas than usual.  Walking can help get rid of the air that was put into your GI tract during the procedure and reduce the bloating. If you had a lower endoscopy (such as a colonoscopy or flexible sigmoidoscopy) you may notice spotting of blood in your stool or on the toilet paper. If you underwent a bowel prep for your procedure, you may not have a normal bowel movement for a few days.  Please Note:  You might notice some irritation and congestion in your nose or some drainage.  This is from the oxygen used during your procedure.  There is no need for concern and it should clear up in a day or so.  SYMPTOMS TO REPORT IMMEDIATELY:  Following lower endoscopy (colonoscopy or flexible sigmoidoscopy):  Excessive amounts of blood in the stool  Significant tenderness or worsening of abdominal pains  Swelling of the abdomen that is new, acute  Fever of 100F or higher.  For urgent or emergent issues, a gastroenterologist can be reached at any hour by calling [phone]. Do not use MyChart messaging for urgent concerns.    DIET:  We do recommend a small meal at first, but then you may proceed to your regular diet.  Drink plenty of fluids but you should avoid  alcoholic beverages for 24 hours.  ACTIVITY:  You should plan to take it easy for the rest of today and you should NOT DRIVE or use heavy machinery until tomorrow (because of the sedation medicines used during the test).    FOLLOW UP: Our staff will call the number listed on your records the next business day following your procedure.  We will call around 7:15- 8:00 am to check on you and address any questions or concerns that you may have regarding the information given to you following your procedure. If we do not reach you, we will leave a message.     If any biopsies were taken you will be contacted by phone or by letter within the next 1-3 weeks.  Please call us  at [phone] if you have not heard about the biopsies in 3 weeks.    SIGNATURES/CONFIDENTIALITY: You and/or your care partner have signed paperwork which will be entered into your electronic medical record.  These signatures attest to the fact that that the information above on your After Visit Summary has been reviewed and is understood.  Full responsibility of the confidentiality of this discharge information lies with you and/or your care-partner.

## 2023-03-04 NOTE — Progress Notes (Signed)
 Called to room to assist during endoscopic procedure.  Patient ID and intended procedure confirmed with present staff. Received instructions for my participation in the procedure from the performing physician.

## 2023-03-04 NOTE — Progress Notes (Signed)
To pacu, VSS. Report to Rn,tb

## 2023-03-04 NOTE — Op Note (Signed)
 [facility] Patient Name: Whitney Hines Procedure Date: 03/04/2023 9:53 AM MRN: [id number] Endoscopist: Sandor Flatter , MD, [id number] Age: 73 Referring MD:  Date of Birth: Dec 25, 1950 Gender: Female Account #: [id number] Procedure:                Colonoscopy Indications:              Surveillance: Personal history of adenomatous                            polyps on last colonoscopy 3 years ago                           Last colonoscopy was 11/2019 and notable for 3                            adenomas ranging 2-12 mm in size. Medicines:                Monitored Anesthesia Care Procedure:                Pre-Anesthesia Assessment:                           - Prior to the procedure, a History and Physical                            was performed, and patient medications and                            allergies were reviewed. The patient's tolerance of                            previous anesthesia was also reviewed. The risks                            and benefits of the procedure and the sedation                            options and risks were discussed with the patient.                            All questions were answered, and informed consent                            was obtained. Prior Anticoagulants: The patient has                            taken no anticoagulant or antiplatelet agents. ASA                            Grade Assessment: II - A patient with mild systemic                            disease. After reviewing the risks and benefits,  the patient was deemed in satisfactory condition to                            undergo the procedure.                           After obtaining informed consent, the colonoscope                            was passed under direct vision. Throughout the                            procedure, the patient's blood pressure, pulse, and                            oxygen saturations were monitored continuously.  The                            Olympus Scope SN: [id number] was introduced through                            the anus and advanced to the the terminal ileum.                            The colonoscopy was performed without difficulty.                            The patient tolerated the procedure well. The                            quality of the bowel preparation was good. The                            terminal ileum, ileocecal valve, appendiceal                            orifice, and rectum were photographed. Scope In: 10:46:34 AM Scope Out: 11:02:40 AM Scope Withdrawal Time: 0 hours 14 minutes 3 seconds  Total Procedure Duration: 0 hours 16 minutes 6 seconds  Findings:                 The perianal and digital rectal examinations were                            normal.                           An 8 mm polyp was found in the ascending colon. The                            polyp was sessile. The polyp was removed with a                            cold snare. Resection and retrieval were complete.  Estimated blood loss was minimal.                           A 2 mm polyp was found in the ascending colon. The                            polyp was sessile. The polyp was removed with a                            cold snare. Resection and retrieval were complete.                            Estimated blood loss was minimal.                           The exam was otherwise normal throughout the                            remainder of the colon.                           The retroflexed view of the distal rectum and anal                            verge was normal and showed no anal or rectal                            abnormalities.                           The terminal ileum appeared normal. Complications:            No immediate complications. Estimated Blood Loss:     Estimated blood loss was minimal. Impression:               - One 8 mm polyp in the ascending  colon, removed                            with a cold snare. Resected and retrieved.                           - One 2 mm polyp in the ascending colon, removed                            with a cold snare. Resected and retrieved.                           - The distal rectum and anal verge are normal on                            retroflexion view.                           - The examined portion of the ileum was normal. Recommendation:           -  Patient has a contact number available for                            emergencies. The signs and symptoms of potential                            delayed complications were discussed with the                            patient. Return to normal activities tomorrow.                            Written discharge instructions were provided to the                            patient.                           - Resume previous diet.                           - Continue present medications.                           - Await pathology results.                           - Repeat colonoscopy in 5 years for surveillance                            based on pathology results.                           - Return to GI office PRN. Sandor Flatter, MD 03/04/2023 11:07:08 AM

## 2023-03-04 NOTE — Progress Notes (Signed)
 GASTROENTEROLOGY PROCEDURE H&P NOTE   Primary Care Physician: Jodie Lavern CROME, MD    Reason for Procedure:  Colon polyp surveillance  Plan:    Colonoscopy  Patient is appropriate for endoscopic procedure(s) in the ambulatory ([facility]) setting.  The nature of the procedure, as well as the risks, benefits, and alternatives were carefully and thoroughly reviewed with the patient. Ample time for discussion and questions allowed. The patient understood, was satisfied, and agreed to proceed.     HPI: Whitney Hines is a 73 y.o. female who presents for colonoscopy for ongoing colon polyp surveillance and colon cancer screening.  No active GI symptoms.  No known family history of colon cancer or related malignancy.  Patient is otherwise without complaints or active issues today.  Last colonoscopy was 11/2019 and notable for 3 adenomas ranging 2-12 mm in size, with recommendation to repeat in 3 years.   Prior to that, colonoscopy in 12/2014 notable for 2 subcentimeter tubular adenomas.  Past Medical History:  Diagnosis Date   Gout    High blood pressure    Neuropathy    Osteopenia 04/26/2018   dexa 03/2018: lowest T = -1.6 femur. Recheck 2 years   Seizure [facility]) 11/1997    Past Surgical History:  Procedure Laterality Date   COLONOSCOPY  2016   Dr.  Vicci   COLONOSCOPY WITH PROPOFOL  N/A 01/07/2015   Procedure: COLONOSCOPY WITH PROPOFOL ;  Surgeon: Gladis MARLA Vicci, MD;  Location: [facility];  Service: Endoscopy;  Laterality: N/A;   MULTIPLE TOOTH EXTRACTIONS      Prior to Admission medications   Medication Sig Start Date End Date Taking? Authorizing Provider  allopurinol (ZYLOPRIM) 100 MG tablet Take 100 mg by mouth daily. 01/25/23  Yes [provider]  carvedilol (COREG) 12.5 MG tablet Take 12.5 mg by mouth 2 (two) times daily. 12/10/22  Yes [provider]  furosemide (LASIX) 20 MG tablet Take 20 mg by mouth every morning. 02/19/23  Yes [provider]  PHENObarbital  (LUMINAL) 100 MG tablet TAKE 1 TABLET BY MOUTH EVERYDAY AT BEDTIME 03/02/23  Yes Gayland Lauraine PARAS, NP  potassium chloride (KLOR-CON) 10 MEQ tablet Take 10 mEq by mouth daily. 02/19/23  Yes [provider]  rosuvastatin  (CRESTOR ) 5 MG tablet Take 5 mg by mouth at bedtime. 12/31/22  Yes [provider]  lisinopril -hydrochlorothiazide  (ZESTORETIC ) 20-12.5 MG tablet Take 1 tablet by mouth daily. Patient not taking: Reported on 02/05/2023 08/20/21   Jodie Lavern CROME, MD    Current Outpatient Medications  Medication Sig Dispense Refill   allopurinol (ZYLOPRIM) 100 MG tablet Take 100 mg by mouth daily.     carvedilol (COREG) 12.5 MG tablet Take 12.5 mg by mouth 2 (two) times daily.     furosemide (LASIX) 20 MG tablet Take 20 mg by mouth every morning.     PHENObarbital  (LUMINAL) 100 MG tablet TAKE 1 TABLET BY MOUTH EVERYDAY AT BEDTIME 90 tablet 1   potassium chloride (KLOR-CON) 10 MEQ tablet Take 10 mEq by mouth daily.     rosuvastatin  (CRESTOR ) 5 MG tablet Take 5 mg by mouth at bedtime.     lisinopril -hydrochlorothiazide  (ZESTORETIC ) 20-12.5 MG tablet Take 1 tablet by mouth daily. (Patient not taking: Reported on 02/05/2023) 90 tablet 3   Current Facility-Administered Medications  Medication Dose Route Frequency Provider Last Rate Last Admin   0.9 %  sodium chloride  infusion  500 mL Intravenous Continuous Aydrian Halpin V, DO        Allergies as  of 03/04/2023 - Review Complete 03/04/2023  Allergen Reaction Noted   Dilantin  [phenytoin sodium extended] Hives 08/23/2013   Dilantin [phenytoin] Hives 05/04/2013    Family History  Problem Relation Age of Onset   Heart disease Mother    Heart attack Mother    Hypertension Father    Stroke Paternal Aunt    Stroke Paternal Aunt    Stroke Paternal Uncle    Colon cancer Neg Hx    Colon polyps Neg Hx    Esophageal cancer Neg Hx    Rectal cancer Neg Hx    Stomach cancer Neg Hx    Cancer - Colon Neg  Hx     Social History   Socioeconomic History   Marital status: Married    Spouse name: Not on file   Number of children: 0   Years of education: BS   Highest education level: Not on file  Occupational History   Occupation: Event Organiser: [employer]  Tobacco Use   Smoking status: Never   Smokeless tobacco: Never  Vaping Use   Vaping status: Never Used  Substance and Sexual Activity   Alcohol use: No    Alcohol/week: 0.0 standard drinks of alcohol   Drug use: No   Sexual activity: Yes    Birth control/protection: None  Other Topics Concern   Not on file  Social History Narrative   Patient is married and works as a producer, 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    Patient has BS degree.    Patient does not have any children.    Patient drinks 8 ounces of caffeine daily.    Patient is left handed.    Social Drivers of Corporate Investment Banker Strain: Not on file  Food Insecurity: Not on file  Transportation Needs: Not on file  Physical Activity: Not on file  Stress: Not on file  Social Connections: Not on file  Intimate Partner Violence: Not on file    Physical Exam: Vital signs in last 24 hours: @BP  (!) 160/77   Pulse 74   Temp (!) 97.2 F (36.2 C) (Temporal)   Ht 5' 1 (1.549 m)   Wt 168 lb (76.2 kg)   LMP  (LMP Unknown) Comment: LMP 2004  SpO2 98%   BMI 31.74 kg/m  GEN: NAD EYE: Sclerae anicteric ENT: MMM CV: Non-tachycardic Pulm: CTA b/l GI: Soft, NT/ND NEURO:  Alert & Oriented x 3   Sandor Flatter, DO [facility]   03/04/2023 10:05 AM

## 2023-03-04 NOTE — Progress Notes (Signed)
 Pt's states no medical or surgical changes since previsit or office visit.

## 2023-03-05 ENCOUNTER — Telehealth: Payer: Self-pay | Admitting: *Deleted

## 2023-03-05 NOTE — Telephone Encounter (Signed)
 Left message on f/u call

## 2023-03-08 LAB — SURGICAL PATHOLOGY

## 2023-03-12 ENCOUNTER — Encounter: Payer: Self-pay | Admitting: Gastroenterology

## 2023-03-17 ENCOUNTER — Other Ambulatory Visit: Payer: Self-pay | Admitting: Family Medicine

## 2023-03-17 DIAGNOSIS — M8580 Other specified disorders of bone density and structure, unspecified site: Secondary | ICD-10-CM

## 2023-08-03 ENCOUNTER — Encounter: Payer: Self-pay | Admitting: Neurology

## 2023-08-03 ENCOUNTER — Ambulatory Visit (INDEPENDENT_AMBULATORY_CARE_PROVIDER_SITE_OTHER): Payer: BC Managed Care – PPO | Admitting: Neurology

## 2023-08-03 VITALS — BP 138/78 | HR 68 | Ht 61.0 in | Wt 171.5 lb

## 2023-08-03 DIAGNOSIS — G40309 Generalized idiopathic epilepsy and epileptic syndromes, not intractable, without status epilepticus: Secondary | ICD-10-CM | POA: Diagnosis not present

## 2023-08-03 NOTE — Progress Notes (Signed)
 PATIENT: Whitney Hines DOB: 01/12/1951  REASON FOR VISIT: follow up for seizures  HISTORY FROM: patient PRIMARY NEUROLOGIST: Dr. Samara Crest  HISTORY OF PRESENT ILLNESS: Today 08/03/23  Doing well.  New PCP.  No seizures.  Remains on phenobarbital .  Continues to work at [employer], may work 1 more year.  No new issues.  Update 07/28/22 SS: Labs last year June 2023 vitamin D  36.4, creatinine 1.27, phenobarbital  31. Has a new PCP, who did labs recently that are not available to me. No seizures since 1999. No health issues. Continues to work at [employer]. Is really doing great. No new issues. Takes Vitamin D .   Update 07/29/21 SS: Ms. Lauf is here today for follow-up.  Remains on phenobarbital .  No recent seizures. Labs from PCP 07/26/20 creatinine 1.38. continues to work full time, enjoys it. In PT for left knee. Health is good, looking for new PCP. Looks great today.   Update 07/17/2020 SS: Ms. Graffeo is a 73 year old female with history of seizure disorder her last seizure was in 1999, only 3 seizures in her life ever.  She is doing well on phenobarbital .  Tolerates without side effect.  She continues to work full-time at [employer].  She is planning to retire next year.  She denies any medical issues.  She is due to see her PCP for physical next week.  Here today for evaluation unaccompanied.  07/13/2019 SS: Ms. Beegle is a 73 year old female history of seizure disorder.  Her last seizure was in 1999.  She remains on phenobarbital .  She is tolerating well.  She continues to work full-time [employer].  She lives with her husband, drives a car.  She has lost about 30 pounds she reports.  She denies any new problems or concerns.  She is doing well.  Presents today for follow-up unaccompanied.  HISTORY 01/17/2019 SS: Ms. Lajeunesse is a 73 year old female with history of seizure disorder.  Her last seizure occurred in 1999.  She remains on phenobarbital . She is tolerating the medication without side effect.   She has continued to work full-time at [employer].  She lives with her husband, drives a car.  She denies any issues with balance or falls.  She denies any new problems or concerns.  She presents today for follow-up unaccompanied.    REVIEW OF SYSTEMS: Out of a complete 14 system review of symptoms, the patient complains only of the following symptoms, and all other reviewed systems are negative.  See HPI  ALLERGIES: Allergies  Allergen Reactions   Dilantin  [Phenytoin Sodium Extended] Hives   Dilantin [Phenytoin] Hives    HOME MEDICATIONS: Outpatient Medications Prior to Visit  Medication Sig Dispense Refill   carvedilol (COREG) 12.5 MG tablet Take 12.5 mg by mouth 2 (two) times daily.     PHENObarbital  (LUMINAL) 100 MG tablet TAKE 1 TABLET BY MOUTH EVERYDAY AT BEDTIME 90 tablet 1   rosuvastatin  (CRESTOR ) 5 MG tablet Take 5 mg by mouth at bedtime.     allopurinol (ZYLOPRIM) 100 MG tablet Take 100 mg by mouth daily. (Patient not taking: Reported on 08/03/2023)     furosemide (LASIX) 20 MG tablet Take 20 mg by mouth every morning. (Patient not taking: Reported on 08/03/2023)     lisinopril -hydrochlorothiazide  (ZESTORETIC ) 20-12.5 MG tablet Take 1 tablet by mouth daily. (Patient not taking: Reported on 08/03/2023) 90 tablet 3   potassium chloride (KLOR-CON) 10 MEQ tablet Take 10 mEq by mouth daily. (Patient not taking: Reported on 08/03/2023)  No facility-administered medications prior to visit.    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 Recheck 2 years   Seizure [facility]) 11/1997    PAST SURGICAL HISTORY: Past Surgical History:  Procedure Laterality Date   COLONOSCOPY  2016   Dr.  Lincoln Renshaw   COLONOSCOPY WITH PROPOFOL  N/A 01/07/2015   Procedure: COLONOSCOPY WITH PROPOFOL ;  Surgeon: Garrett Kallman, MD;  Location: [facility];  Service: Endoscopy;   Laterality: N/A;   MULTIPLE TOOTH EXTRACTIONS      FAMILY HISTORY: Family History  Problem Relation Age of Onset   Heart disease Mother    Heart attack Mother    Hypertension Father    Stroke Paternal Aunt    Stroke Paternal Aunt    Stroke Paternal Uncle    Colon cancer Neg Hx    Colon polyps Neg Hx    Esophageal cancer Neg Hx    Rectal cancer Neg Hx    Stomach cancer Neg Hx    Cancer - Colon Neg Hx     SOCIAL HISTORY: Social History   Socioeconomic History   Marital status: Married    Spouse name: Not on file   Number of children: 0   Years of education: BS   Highest education level: Not on file  Occupational History   Occupation: Event organiser: [employer]  Tobacco Use   Smoking status: Never   Smokeless tobacco: Never  Vaping Use   Vaping status: Never Used  Substance and Sexual Activity   Alcohol use: No    Alcohol/week: 0.0 standard drinks of alcohol   Drug use: No   Sexual activity: Yes    Birth control/protection: None  Other Topics Concern   Not on file  Social History Narrative   Patient is married and works as a Producer, 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    Patient has BS degree.    Patient does not have any children.    Patient drinks 8 ounces of caffeine daily.    Patient is left handed.    Social Drivers of Corporate investment banker Strain: Not on file  Food Insecurity: Not on file  Transportation Needs: Not on file  Physical Activity: Not on file  Stress: Not on file  Social Connections: Not on file  Intimate Partner Violence: Not on file   PHYSICAL EXAM  Vitals:   08/03/23 1513  BP: 138/78  Pulse: 68  Weight: 171 lb 8 oz (77.8 kg)  Height: 5\' 1"  (1.549 m)   Body mass index is 32.4 kg/m.  Generalized: Well developed, in no acute distress   Neurological examination  Mentation: Alert oriented to time, place, history taking. Follows all commands speech and language  fluent Cranial nerve II-XII: Pupils were equal round reactive to light. Extraocular movements were full, visual field were full on confrontational test. Facial sensation and strength were normal.  Head turning and shoulder shrug  were normal and symmetric. Motor: The motor testing reveals 5 over 5 strength of all 4 extremities. Good symmetric motor tone is noted throughout.  Sensory: Sensory testing is intact to soft touch on all 4 extremities. No evidence of extinction is noted.  Coordination: Cerebellar testing reveals good finger-nose-finger and heel-to-shin bilaterally.  Gait and station: Gait is normal.  Reflexes:  Deep tendon reflexes are symmetric and normal bilaterally.   DIAGNOSTIC DATA (LABS, IMAGING, TESTING) - I reviewed patient records, labs, notes, testing and imaging myself where available.  Lab Results  Component Value Date   WBC 7.5 08/20/2021   HGB 11.3 (L) 08/20/2021   HCT 33.7 (L) 08/20/2021   MCV 93.6 08/20/2021   PLT 256.0 08/20/2021      Component Value Date/Time   NA 135 08/20/2021 1621   NA 138 07/29/2021 1618   K 4.5 08/20/2021 1621   CL 100 08/20/2021 1621   CO2 27 08/20/2021 1621   GLUCOSE 59 (L) 08/20/2021 1621   BUN 33 (H) 08/20/2021 1621   BUN 24 07/29/2021 1618   CREATININE 1.43 (H) 08/20/2021 1621   CALCIUM  9.8 08/20/2021 1621   PROT 8.4 (H) 08/20/2021 1621   PROT 8.5 07/29/2021 1618   ALBUMIN 4.1 08/20/2021 1621   ALBUMIN 4.2 07/29/2021 1618   AST 26 08/20/2021 1621   ALT 18 08/20/2021 1621   ALKPHOS 117 08/20/2021 1621   BILITOT 0.4 08/20/2021 1621   BILITOT 0.3 07/29/2021 1618   GFRNONAA 39 (L) 07/13/2019 1515   GFRAA 45 (L) 07/13/2019 1515   Lab Results  Component Value Date   CHOL 169 08/20/2021   HDL 39.90 08/20/2021   LDLCALC 94 08/20/2021   LDLDIRECT 87.0 02/25/2017   TRIG 174.0 (H) 08/20/2021   CHOLHDL 4 08/20/2021   Lab Results  Component Value Date   HGBA1C 5.7 08/20/2021   No results found for: "VITAMINB12" Lab  Results  Component Value Date   TSH 2.96 08/20/2021   ASSESSMENT AND PLAN 73 y.o. year old female  has a past medical history of Gout, High blood pressure, Neuropathy, Osteopenia (04/26/2018), and Seizure ([facility]) (11/1997). here with:  1.  History of seizures, well controlled  -Continues to do great.  Last seizure was in 1999 -Continue Phenobarbital  for seizure prevention, needs refill next month - Check routine labs today  -Call for seizures, return here in 1 year or sooner if needed  Orders Placed This Encounter  Procedures   CBC with Differential/Platelet   CMP   Phenobarbital  level   Vitamin D , 25-hydroxy   Jeanmarie Millet, AGNP-C, DNP 08/03/2023, 3:27 PM [facility] [address] [place], [place] [address] [phone]

## 2023-08-03 NOTE — Patient Instructions (Signed)
 Great to see you today.  Check labs today.  Continue phenobarbital  for seizure management.  Call for any seizure activity.  Follow-up in 1 year.  Thanks!!

## 2023-08-04 ENCOUNTER — Ambulatory Visit: Payer: Self-pay | Admitting: Neurology

## 2023-08-04 LAB — CBC WITH DIFFERENTIAL/PLATELET
Basophils Absolute: 0 10*3/uL (ref 0.0–0.2)
Basos: 0 %
EOS (ABSOLUTE): 0 10*3/uL (ref 0.0–0.4)
Eos: 0 %
Hematocrit: 36.4 % (ref 34.0–46.6)
Hemoglobin: 12 g/dL (ref 11.1–15.9)
Immature Grans (Abs): 0 10*3/uL (ref 0.0–0.1)
Immature Granulocytes: 0 %
Lymphocytes Absolute: 2.5 10*3/uL (ref 0.7–3.1)
Lymphs: 37 %
MCH: 30.6 pg (ref 26.6–33.0)
MCHC: 33 g/dL (ref 31.5–35.7)
MCV: 93 fL (ref 79–97)
Monocytes Absolute: 0.4 10*3/uL (ref 0.1–0.9)
Monocytes: 6 %
Neutrophils Absolute: 3.8 10*3/uL (ref 1.4–7.0)
Neutrophils: 57 %
Platelets: 263 10*3/uL (ref 150–450)
RBC: 3.92 x10E6/uL (ref 3.77–5.28)
RDW: 12.9 % (ref 11.7–15.4)
WBC: 6.8 10*3/uL (ref 3.4–10.8)

## 2023-08-04 LAB — COMPREHENSIVE METABOLIC PANEL WITH GFR
ALT: 16 IU/L (ref 0–32)
AST: 23 IU/L (ref 0–40)
Albumin: 4.3 g/dL (ref 3.8–4.8)
Alkaline Phosphatase: 137 IU/L — ABNORMAL HIGH (ref 44–121)
BUN/Creatinine Ratio: 18 (ref 12–28)
BUN: 23 mg/dL (ref 8–27)
Bilirubin Total: 0.2 mg/dL (ref 0.0–1.2)
CO2: 23 mmol/L (ref 20–29)
Calcium: 7.4 mg/dL — ABNORMAL LOW (ref 8.7–10.3)
Chloride: 105 mmol/L (ref 96–106)
Creatinine, Ser: 1.27 mg/dL — ABNORMAL HIGH (ref 0.57–1.00)
Globulin, Total: 3.5 g/dL (ref 1.5–4.5)
Glucose: 79 mg/dL (ref 70–99)
Potassium: 4.5 mmol/L (ref 3.5–5.2)
Sodium: 144 mmol/L (ref 134–144)
Total Protein: 7.8 g/dL (ref 6.0–8.5)
eGFR: 45 mL/min/{1.73_m2} — ABNORMAL LOW (ref >59)

## 2023-08-04 LAB — PHENOBARBITAL LEVEL: Phenobarbital, Serum: 31 ug/mL (ref 15–40)

## 2023-08-04 LAB — VITAMIN D 25 HYDROXY (VIT D DEFICIENCY, FRACTURES): Vit D, 25-Hydroxy: 55 ng/mL (ref 30.0–100.0)

## 2023-09-05 ENCOUNTER — Other Ambulatory Visit: Payer: Self-pay | Admitting: Neurology

## 2023-09-05 DIAGNOSIS — G40309 Generalized idiopathic epilepsy and epileptic syndromes, not intractable, without status epilepticus: Secondary | ICD-10-CM

## 2023-09-06 NOTE — Telephone Encounter (Signed)
 Requested Prescriptions   Pending Prescriptions Disp Refills   PHENObarbital  (LUMINAL) 100 MG tablet [Pharmacy Med Name: PHENOBARBITAL  100 MG TABLET] 90 tablet     Sig: TAKE 1 TABLET BY MOUTH EVERYDAY AT BEDTIME   Last seen 08/03/23 Next appt scheduled 08/10/24  Dispenses   Dispensed Days Supply Quantity Provider Pharmacy  PHENOBARBITAL  100 MG TABLET 06/03/2023 90 90 each Gayland Lauraine PARAS, NP [facility] [phone] - [facility]...  PHENOBARBITAL  100 MG TABLET 03/02/2023 90 90 each Gayland Lauraine PARAS, NP [facility] [phone] - G...  PHENOBARBITAL  100 MG TABLET 11/13/2022 90 90 each Gayland Lauraine PARAS, NP [facility] [phone] - [place]

## 2023-12-10 ENCOUNTER — Other Ambulatory Visit: Payer: Self-pay | Admitting: Family Medicine

## 2023-12-10 DIAGNOSIS — Z1231 Encounter for screening mammogram for malignant neoplasm of breast: Secondary | ICD-10-CM

## 2024-01-04 ENCOUNTER — Ambulatory Visit

## 2024-01-07 ENCOUNTER — Ambulatory Visit
Admission: RE | Admit: 2024-01-07 | Discharge: 2024-01-07 | Disposition: A | Discharge status: Home / Self Care | Source: Ambulatory Visit | Attending: Family Medicine | Admitting: Family Medicine

## 2024-01-07 DIAGNOSIS — Z1231 Encounter for screening mammogram for malignant neoplasm of breast: Secondary | ICD-10-CM

## 2024-08-10 ENCOUNTER — Ambulatory Visit: Admitting: Neurology
